# Patient Record
Sex: Female | Born: 1980 | Hispanic: No | Marital: Married | Smoking: Never smoker
Health system: Southern US, Community
[De-identification: ages and names within clinical notes are randomized; demographics above are authoritative.]

---

## 2007-01-06 ENCOUNTER — Emergency Department: Payer: Self-pay | Admitting: Emergency Medicine

## 2007-01-27 ENCOUNTER — Observation Stay: Payer: Self-pay | Admitting: Obstetrics and Gynecology

## 2007-05-04 ENCOUNTER — Observation Stay: Payer: Self-pay | Admitting: Obstetrics and Gynecology

## 2007-05-07 ENCOUNTER — Observation Stay: Payer: Self-pay

## 2007-05-09 ENCOUNTER — Observation Stay: Payer: Self-pay

## 2007-05-12 ENCOUNTER — Observation Stay: Payer: Self-pay

## 2007-05-15 ENCOUNTER — Observation Stay: Payer: Self-pay | Admitting: Obstetrics and Gynecology

## 2007-05-18 ENCOUNTER — Observation Stay: Payer: Self-pay | Admitting: Obstetrics and Gynecology

## 2007-05-20 ENCOUNTER — Observation Stay: Payer: Self-pay

## 2007-05-21 ENCOUNTER — Observation Stay: Payer: Self-pay | Admitting: Obstetrics and Gynecology

## 2007-05-23 ENCOUNTER — Inpatient Hospital Stay: Payer: Self-pay

## 2007-05-27 ENCOUNTER — Emergency Department: Payer: Self-pay | Admitting: Emergency Medicine

## 2007-05-27 ENCOUNTER — Other Ambulatory Visit: Payer: Self-pay

## 2009-06-09 IMAGING — US US OB US >=[ID] SNGL FETUS
1 series · 17 of 28 positions shown · non-contrast
Comparison: none

REASON FOR EXAM: fetal growth
COMMENTS:

[Series 1: us ob us >=(id) sngl fetus · 17 of 40 slices shown]
[im 1/40]
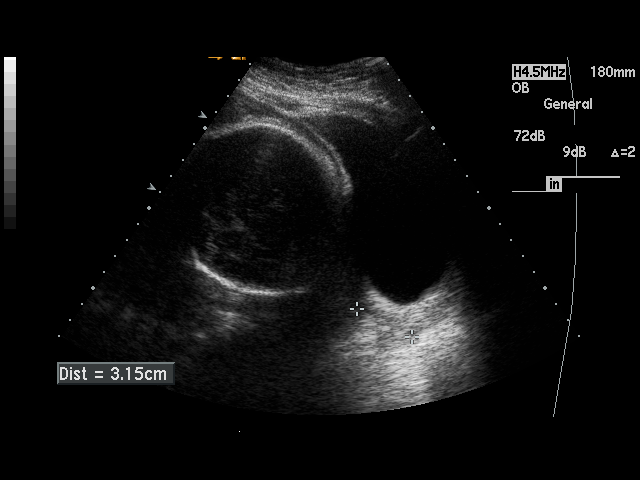
[im 3/40]
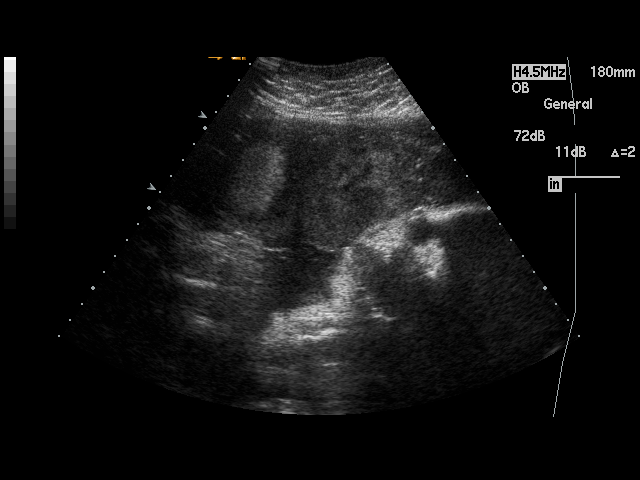
[im 6/40]
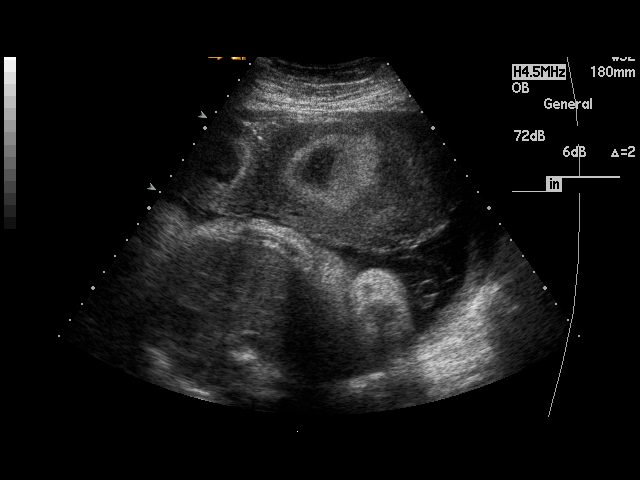
[im 8/40]
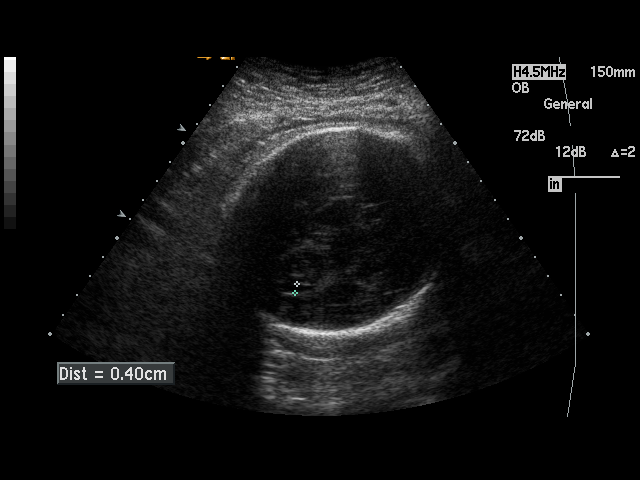
[im 11/40]
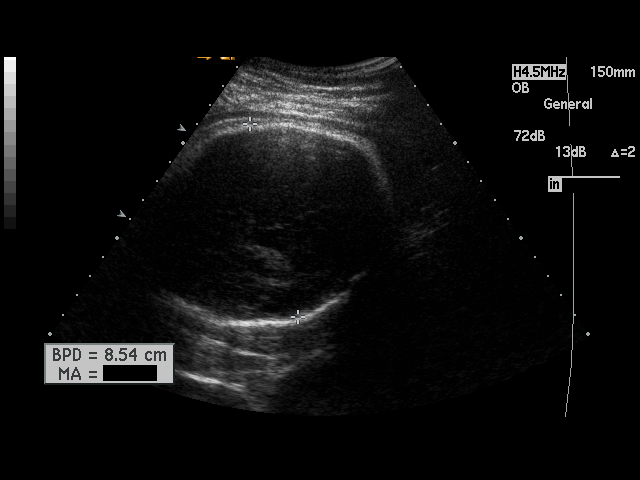
[im 14/40]
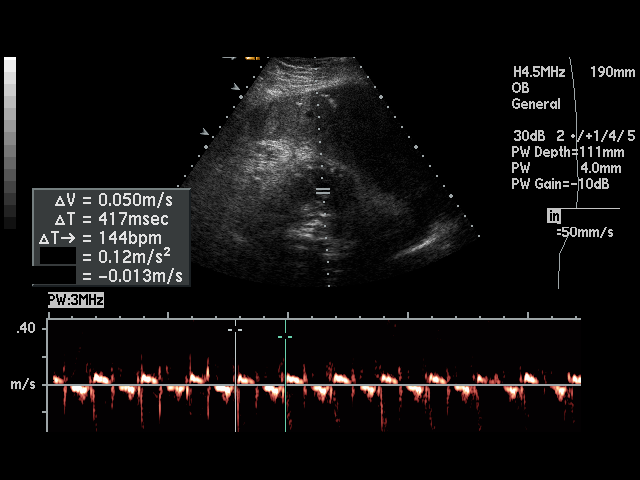
[im 15/40]
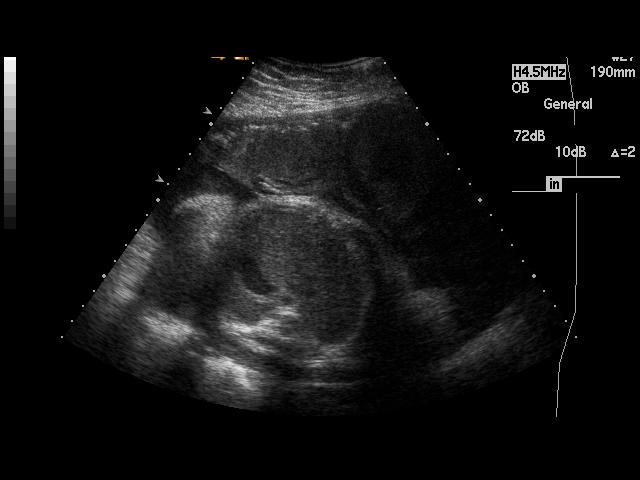
[im 18/40]
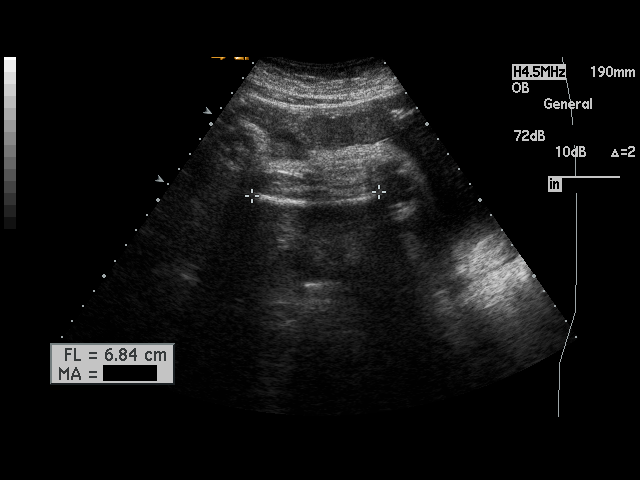
[im 21/40]
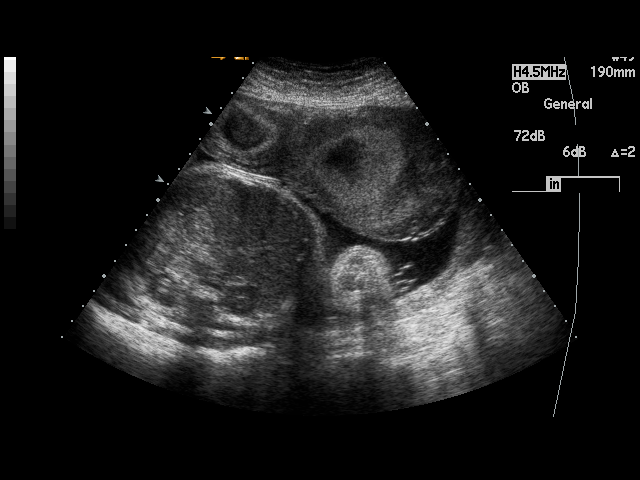
[im 22/40]
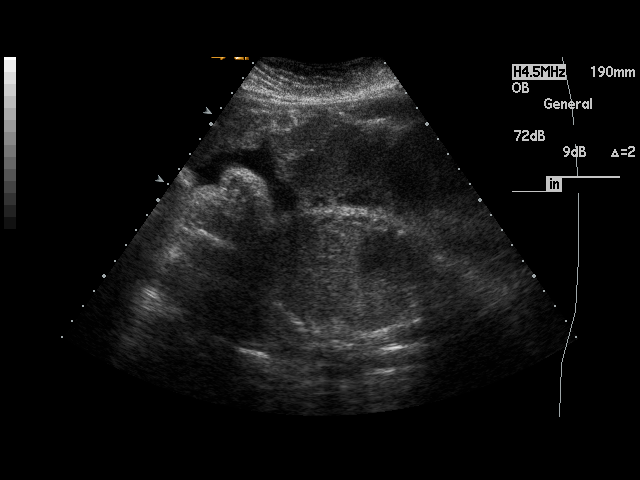
[im 25/40]
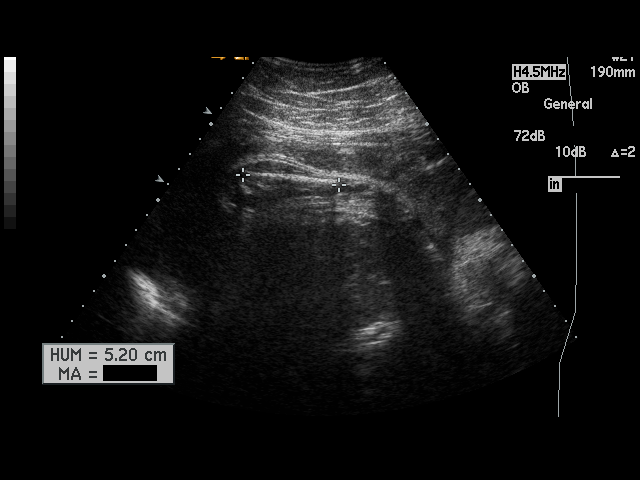
[im 27/40]
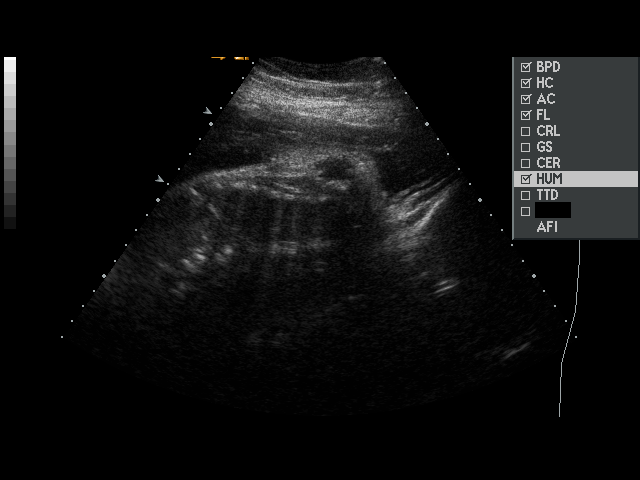
[im 29/40]
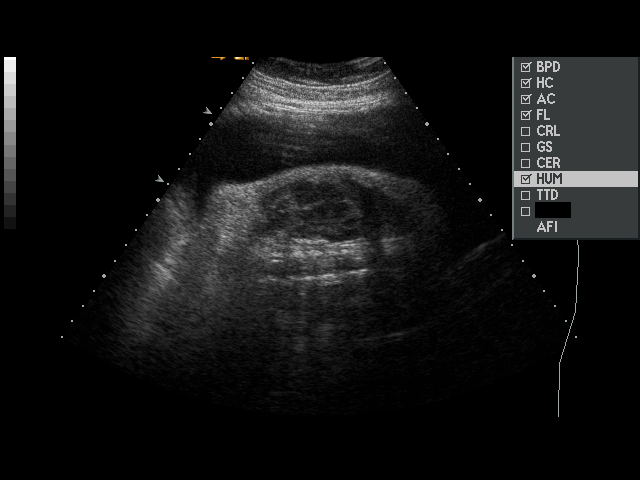
[im 32/40]
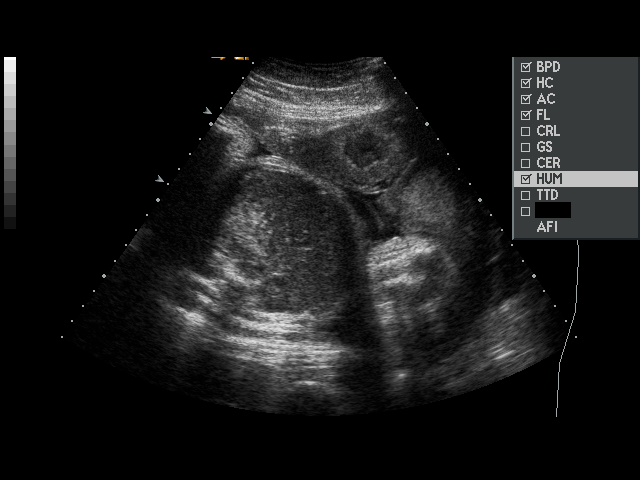
[im 34/40]
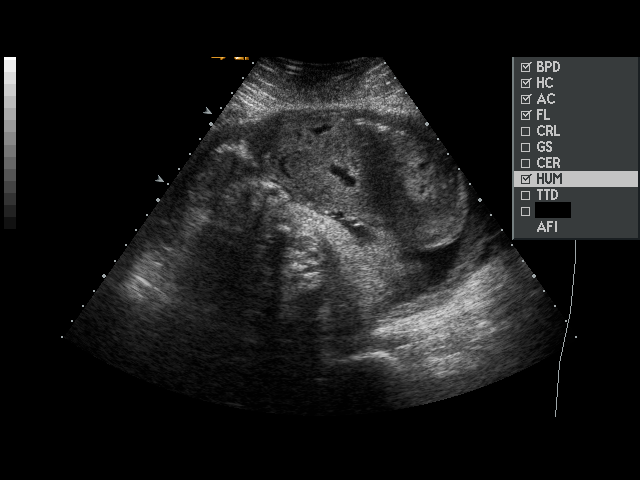
[im 37/40]
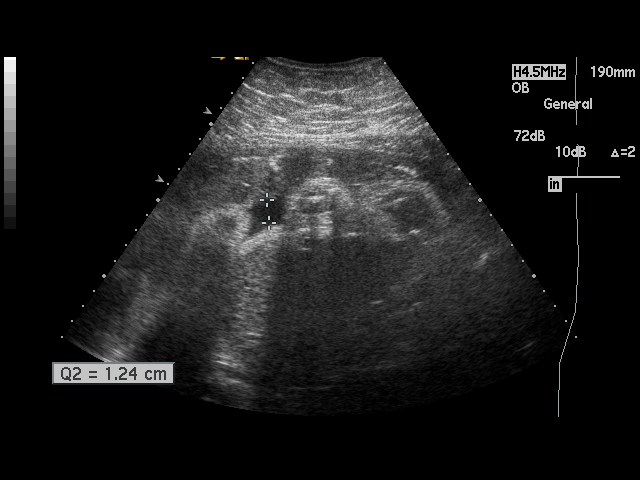
[im 40/40]
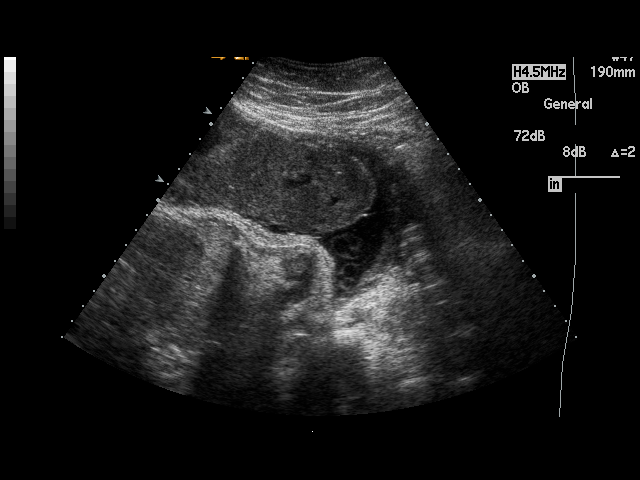

[17 of 28 positions shown; findings below may reference images not displayed]

PROCEDURE:     US  - US OB GREATER/OR EQUAL TO 8CCUS  - May 15, 2007  [DATE]

RESULT:     There is a viable IUP with cephalic presentation. The placenta
is anterior. The amniotic fluid index is 14.5 which is at approximately the
50th percentile. A four-chamber heart with a rate of 144 beats per minute
was seen. The intracranial structures were normal in appearance. Limited
evaluation of the spine revealed no acute abnormality. The fetal stomach,
kidneys, and urinary bladder were demonstrated.

Measured parameters:

BPD       8.54 cm      Corresponding to EGA of 34 weeks 3 days
HC        31.48 cm      Corresponding to EGA of 35 weeks 2 days
AC        31.65 cm      Corresponding to EGA of 35 weeks 4 days
FL          6.88 cm      Corresponding to EGA of 35 weeks 2 days

The estimated fetal weight is 5488 grams + / - 439 grams.
IMPRESSION: There is a viable IUP with cephalic presentation. The estimated gestational
age is 34 weeks 0 days + / - 21 days. The estimated date of confinement is 26 June, 2007. This is approximately 7 days after that date predicted on
clinical grounds. No fetal anomalies were identified.

## 2011-03-07 ENCOUNTER — Encounter: Payer: Self-pay | Admitting: Maternal & Fetal Medicine

## 2011-03-07 LAB — BASIC METABOLIC PANEL
Anion Gap: 10 (ref 7–16)
BUN: 8 mg/dL (ref 7–18)
Chloride: 104 mmol/L (ref 98–107)
Creatinine: 0.37 mg/dL — ABNORMAL LOW (ref 0.60–1.30)
EGFR (African American): >60
EGFR (Non-African Amer.): >60
Osmolality: 277 (ref 275–301)

## 2011-03-07 LAB — HEPATIC FUNCTION PANEL A (ARMC)
Bilirubin, Direct: <0.1 mg/dL (ref 0.00–0.20)
Bilirubin,Total: 0.3 mg/dL (ref 0.2–1.0)
SGPT (ALT): 18 U/L

## 2011-03-07 LAB — PROTEIN / CREATININE RATIO, URINE
Creatinine, Urine: 40.7 mg/dL (ref 30.0–125.0)
Protein, Random Urine: <5 mg/dL — ABNORMAL LOW (ref 0–12)

## 2011-04-07 ENCOUNTER — Encounter: Payer: Self-pay | Admitting: Maternal and Fetal Medicine

## 2011-06-30 ENCOUNTER — Encounter: Payer: Self-pay | Admitting: Obstetrics and Gynecology

## 2011-07-20 ENCOUNTER — Observation Stay: Payer: Self-pay

## 2011-07-20 LAB — URINALYSIS, COMPLETE
Bilirubin,UR: NEGATIVE
Blood: NEGATIVE
Glucose,UR: NEGATIVE mg/dL (ref 0–75)
Ketone: NEGATIVE
Leukocyte Esterase: NEGATIVE
Nitrite: NEGATIVE
Ph: 6 (ref 4.5–8.0)

## 2011-09-09 ENCOUNTER — Observation Stay: Payer: Self-pay

## 2011-09-14 ENCOUNTER — Observation Stay: Payer: Self-pay | Admitting: *Deleted

## 2011-09-14 ENCOUNTER — Inpatient Hospital Stay: Payer: Self-pay | Admitting: Obstetrics and Gynecology

## 2011-09-14 LAB — CBC WITH DIFFERENTIAL/PLATELET
Basophil #: 0 10*3/uL (ref 0.0–0.1)
Eosinophil #: 0 10*3/uL (ref 0.0–0.7)
HCT: 35 % (ref 35.0–47.0)
HGB: 11.4 g/dL — ABNORMAL LOW (ref 12.0–16.0)
Lymphocyte #: 1.1 10*3/uL (ref 1.0–3.6)
Lymphocyte %: 10.5 %
MCH: 28.9 pg (ref 26.0–34.0)
MCHC: 32.6 g/dL (ref 32.0–36.0)
MCV: 89 fL (ref 80–100)
Monocyte #: 0.6 x10 3/mm (ref 0.2–0.9)
Neutrophil #: 8.9 10*3/uL — ABNORMAL HIGH (ref 1.4–6.5)
Neutrophil %: 83.8 %
Platelet: 201 10*3/uL (ref 150–440)
RBC: 3.95 10*6/uL (ref 3.80–5.20)
RDW: 14.8 % — ABNORMAL HIGH (ref 11.5–14.5)
WBC: 10.6 10*3/uL (ref 3.6–11.0)

## 2011-09-16 LAB — HEMOGLOBIN: HGB: 10.1 g/dL — ABNORMAL LOW (ref 12.0–16.0)

## 2014-06-22 NOTE — Consult Note (Signed)
Referral Information:   Reason for Referral 34 yo GG2P0101 with unknown LMP at 13 weeks 1 day by ultrasound today is referred for consultation due to history of preeclampsia in prior pregnancy.  She reports onset of symptoms (edema) and diagnosis at approximately 7 months gestation. She underwent induction of labor (due to worsening blood pressure) and delivered a 5lb 13 oz female infnat at [redacted] weeks gestation.  She is presently taking low dose aspirin.    Referring Physician [facility]    Prenatal Hx as above    Past Obstetrical Hx 04/2007, female, 5lb 13 oz, induction of labor at 36 weeks secondary to severe preeclampsia.   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    Occupation Father retail    Soc Hx married   Misc Urine Chem:  07-Jan-13 09:50    Creatinine, Urine 40.7   Protein, Random Urine < 5  Routine Chem:  07-Jan-13 10:01    Glucose, Serum 88   BUN 8   Creatinine (comp) 0.37   Sodium, Serum 140   Potassium, Serum 3.4   Chloride, Serum 104   CO2, Serum 26   Calcium (Total), Serum 9.0   Anion Gap 10   Osmolality (calc) 277   eGFR (African American) >60   eGFR (Non-African American) >60  Hepatic:  07-Jan-13 10:01    Bilirubin, Total 0.3   Bilirubin, Direct < 0.1   Alkaline Phosphatase 34   SGPT (ALT) 18   SGOT (AST) 16   Total Protein, Serum 7.8   Albumin, Serum 3.3     07-Jan-13 09:42, MFM OB US, Limited Study   Impression/Recommendations:    Impression 34 year old G2P1001 with previous pregnancy complicated by preeclampisa.  We reviewed background risk (5-7%) of preeclampsia and her recurrence risk due to prior history of preterm preeclampsia (I cited an approximately 20% risk for her). We also reviewed the benefits of low dose aspirin for risk reduction in high risk women.  I reviewed recommendations for pregnancy surveillance (below) and addressed her questions.    Recommendations 1. Recommend detailed anatomic survey.  This has been scheduled.  I would also recommend a fetal growth assessment at 30--[redacted] weeks gestation.  If evidence of fetal growth restriction or preeclampsia, surveillance recomemndations will be changed accordingly.  2. I obtained baseline labs today including metabolic panel, liver function testing, and p:c ratio.   Plan:   Genetic Counseling no    Prenatal Diagnosis Options Level II [place]     Total Time Spent with Patient 30 minutes    >50% of visit spent in couseling/coordination of care yes    Office Use Only [address]  Level 2 ( ) NEW office consult exp prob focused   Coding Description: MATERNAL CONDITIONS/HISTORY INDICATION(S).   Obesity - BMI greater than equal to 30.   OTHER: previous pregnancy complicated by preeclampsia.  Electronic Signatures: Manfred Shirts (MD)  (Signed 07-Jan-13 11:41)  Authored: Referral, Home Medications, Allergies, Vital Signs/Notes, Consult, Lab, Radiology, Impression, Plan, Billing, Coding Description   Last Updated: 07-Jan-13 11:41 by Manfred Shirts (MD)

## 2014-07-08 NOTE — H&P (Signed)
L&D Evaluation:  History:   HPI 34 yo G2P0101 with LMP of 09/16/11 & EDD of 09/12/11 [facility] significant for Tdap given at 28 weeks mand 1 PTD aty 36 2/7 weeks with pt taking baby  ASA  q d for previous pre-ecclampsia. Pt c/o " low back pain yest and then today pain moved down to lower abd". Pt is concerned as she delivered early with last pregnancy but, that was due to IOL for pre-ecclampsia.A pos, Antibody neg, RPR NR, RI, GC/CH neg, HebBsAG neg, sickle normal, HIV NR, GC/CH neg, Varivella immune. GBS unknown.    Presents with back pain, contractions    Medications Pre Natal Vitamins    Allergies NKDA    Social History none    Family History Non-Contributory   ROS:   ROS All systems were reviewed.  HEENT, CNS, GI, GU, Respiratory, CV, Renal and Musculoskeletal systems were found to be normal.   Exam:   Vital Signs stable  116/84    Urine Protein 1.008, 3+ bacteria, 6 WBC, 9 epitheal, 1 RBC, neg nitritte, neg protein    General no apparent distress    Mental Status clear    Chest clear    Heart normal sinus rhythm, no murmur/gallop/rubs    Abdomen gravid, non-tender    Estimated Fetal Weight Average for gestational age    Back no CVAT    Edema 1+    Reflexes 1+    Pelvic cervix closed and thick    Mebranes Intact    FHT normal rate with no decels, reactive NST with 2 accels 15 x 15    Ucx irregular    Skin dry    Lymph no lymphadenopathy   Impression:   Impression UTI   Plan:   Plan Antibiotics RX with pt    Comments Limit activity at work to light duty and rest 15 mins every 2 hours   Electronic Signatures: Sharee PimpleJones, Caron W (CNM)  (Signed 22-May-13 16:59)  Authored: L&D Evaluation   Last Updated: 22-May-13 16:59 by Sharee PimpleJones, Caron W (CNM)

## 2014-11-18 ENCOUNTER — Emergency Department
Admission: EM | Admit: 2014-11-18 | Discharge: 2014-11-18 | Disposition: A | Discharge status: Home / Self Care | Payer: 59 | Attending: Emergency Medicine | Admitting: Emergency Medicine

## 2014-11-18 ENCOUNTER — Encounter: Payer: Self-pay | Admitting: Emergency Medicine

## 2014-11-18 DIAGNOSIS — R1030 Lower abdominal pain, unspecified: Secondary | ICD-10-CM | POA: Diagnosis not present

## 2014-11-18 DIAGNOSIS — Z3202 Encounter for pregnancy test, result negative: Secondary | ICD-10-CM | POA: Diagnosis not present

## 2014-11-18 DIAGNOSIS — R300 Dysuria: Secondary | ICD-10-CM | POA: Diagnosis not present

## 2014-11-18 DIAGNOSIS — R350 Frequency of micturition: Secondary | ICD-10-CM | POA: Diagnosis present

## 2014-11-18 LAB — COMPREHENSIVE METABOLIC PANEL
ALBUMIN: 4.1 g/dL (ref 3.5–5.0)
ALT: 16 U/L (ref 14–54)
ANION GAP: 6 (ref 5–15)
AST: 21 U/L (ref 15–41)
Alkaline Phosphatase: 36 U/L — ABNORMAL LOW (ref 38–126)
BILIRUBIN TOTAL: 0.6 mg/dL (ref 0.3–1.2)
BUN: 11 mg/dL (ref 6–20)
CHLORIDE: 103 mmol/L (ref 101–111)
CO2: 29 mmol/L (ref 22–32)
Calcium: 8.9 mg/dL (ref 8.9–10.3)
Creatinine, Ser: 0.6 mg/dL (ref 0.44–1.00)
GFR calc Af Amer: >60 mL/min (ref >60)
GFR calc non Af Amer: >60 mL/min (ref >60)
GLUCOSE: 75 mg/dL (ref 65–99)
POTASSIUM: 3.8 mmol/L (ref 3.5–5.1)
SODIUM: 138 mmol/L (ref 135–145)
TOTAL PROTEIN: 7.5 g/dL (ref 6.5–8.1)

## 2014-11-18 LAB — CBC
HEMATOCRIT: 34.7 % — AB (ref 35.0–47.0)
HEMOGLOBIN: 11.4 g/dL — AB (ref 12.0–16.0)
MCH: 28.3 pg (ref 26.0–34.0)
MCHC: 33 g/dL (ref 32.0–36.0)
MCV: 85.7 fL (ref 80.0–100.0)
Platelets: 250 10*3/uL (ref 150–440)
RBC: 4.04 MIL/uL (ref 3.80–5.20)
RDW: 12.7 % (ref 11.5–14.5)
WBC: 6.9 10*3/uL (ref 3.6–11.0)

## 2014-11-18 LAB — URINALYSIS COMPLETE WITH MICROSCOPIC (ARMC ONLY)
Bilirubin Urine: NEGATIVE
Glucose, UA: NEGATIVE mg/dL
HGB URINE DIPSTICK: NEGATIVE
Ketones, ur: NEGATIVE mg/dL
Leukocytes, UA: NEGATIVE
NITRITE: NEGATIVE
PROTEIN: NEGATIVE mg/dL
SPECIFIC GRAVITY, URINE: 1.012 (ref 1.005–1.030)
WBC UA: NONE SEEN WBC/hpf (ref 0–5)
pH: 6 (ref 5.0–8.0)

## 2014-11-18 LAB — LIPASE, BLOOD: LIPASE: 29 U/L (ref 22–51)

## 2014-11-18 MED ORDER — NITROFURANTOIN MACROCRYSTAL 100 MG PO CAPS: 100.0000 mg | ORAL_CAPSULE | Freq: Two times a day (BID) | ORAL | Status: DC

## 2014-11-18 NOTE — ED Notes (Signed)
preg test was neg. 

## 2014-11-18 NOTE — Discharge Instructions (Signed)

## 2014-11-18 NOTE — ED Notes (Signed)
Pt reports urinary sx since Saturday, reports today lower abdominal pain. Pt reports increased fatigue, hunger.

## 2014-11-18 NOTE — ED Provider Notes (Signed)
[facility] Emergency Department Provider Note  ____________________________________________  Time seen: 3:40 PM  I have reviewed the triage vital signs and the nursing notes.   HISTORY  Chief Complaint Abdominal Pain and Urinary Frequency    HPI Joanne Peterson is a 34 y.o. female who complains of dysuria and frequency for the past 3 days. She also reports feeling tired and having suprapubic pain. No vaginal discharge or bleeding. She is not sexually active anytime recently. No nausea vomiting diarrhea. No fevers chills chest pain or shortness of breath. She's tried cranberry juice without relief. She has a history of urinary tract infections and this feels exactly like it.     History reviewed. No pertinent past medical history.   There are no active problems to display for this patient.    History reviewed. No pertinent past surgical history.   Current Outpatient Rx  Name  Route  Sig  Dispense  Refill  . nitrofurantoin (MACRODANTIN) 100 MG capsule   Oral   Take 1 capsule (100 mg total) by mouth 2 (two) times daily.   6 capsule   0      Allergies Review of patient's allergies indicates no known allergies.   No family history on file.  Social History Social History  Substance Use Topics  . Smoking status: Never Smoker   . Smokeless tobacco: None  . Alcohol Use: No    Review of Systems  Constitutional:   No fever or chills. No weight changes Eyes:   No blurry vision or double vision.  ENT:   No sore throat. Cardiovascular:   No chest pain. Respiratory:   No dyspnea or cough. Gastrointestinal:   Negative for abdominal pain, vomiting and diarrhea.  No BRBPR or melena. Genitourinary:   Positive for dysuria and frequency.  Musculoskeletal:   Negative for back pain. No joint swelling or pain. Skin:   Negative for rash. Neurological:   Negative for headaches, focal weakness or numbness. Psychiatric:  No anxiety or depression.    Endocrine:  No hot/cold intolerance, changes in energy, or sleep difficulty.  10-point ROS otherwise negative.  ____________________________________________   PHYSICAL EXAM:  VITAL SIGNS: ED Triage Vitals  Enc Vitals Group     BP 11/18/14 1351 113/67 mmHg     Pulse Rate 11/18/14 1351 76     Resp 11/18/14 1351 16     Temp 11/18/14 1351 98.4 F (36.9 C)     Temp Source 11/18/14 1351 Oral     SpO2 11/18/14 1351 100 %     Weight 11/18/14 1351 170 lb (77.111 kg)     Height 11/18/14 1351  (1.575 m)     Head Cir --      Peak Flow --      Pain Score 11/18/14 1352 4     Pain Loc --      Pain Edu? --      Excl. in GC? --      Constitutional:   Alert and oriented. Well appearing and in no distress. Eyes:   No scleral icterus. No conjunctival pallor. PERRL. EOMI ENT   Head:   Normocephalic and atraumatic.   Nose:   No congestion/rhinnorhea. No septal hematoma   Mouth/Throat:   MMM, no pharyngeal erythema. No peritonsillar mass. No uvula shift.   Neck:   No stridor. No SubQ emphysema. No meningismus. Hematological/Lymphatic/Immunilogical:   No cervical lymphadenopathy. Cardiovascular:   RRR. Normal and symmetric distal pulses are present in all extremities.  No murmurs, rubs, or gallops. Respiratory:   Normal respiratory effort without tachypnea nor retractions. Breath sounds are clear and equal bilaterally. No wheezes/rales/rhonchi. Gastrointestinal:   Soft with suprapubic tenderness that is mild.. No distention. There is no CVA tenderness.  No rebound, rigidity, or guarding. Genitourinary:   deferred Musculoskeletal:   Nontender with normal range of motion in all extremities. No joint effusions.  No lower extremity tenderness.  No edema. Neurologic:   Normal speech and language.  CN 2-10 normal. Motor grossly intact. No pronator drift.  Normal gait. No gross focal neurologic deficits are appreciated.  Skin:    Skin is warm, dry and intact. No rash noted.  No  petechiae, purpura, or bullae. Psychiatric:   Mood and affect are normal. Speech and behavior are normal. Patient exhibits appropriate insight and judgment.  ____________________________________________    LABS (pertinent positives/negatives) (all labs ordered are listed, but only abnormal results are displayed) Labs Reviewed  COMPREHENSIVE METABOLIC PANEL - Abnormal; Notable for the following:    Alkaline Phosphatase 36 (*)    All other components within normal limits  CBC - Abnormal; Notable for the following:    Hemoglobin 11.4 (*)    HCT 34.7 (*)    All other components within normal limits  URINALYSIS COMPLETEWITH MICROSCOPIC ([facility] ONLY) - Abnormal; Notable for the following:    Color, Urine YELLOW (*)    APPearance CLEAR (*)    Bacteria, UA RARE (*)    Squamous Epithelial / LPF 0-5 (*)    All other components within normal limits  LIPASE, BLOOD  POC URINE PREG, ED   urine pregnancy negative ____________________________________________   EKG    ____________________________________________    RADIOLOGY    ____________________________________________   PROCEDURES   ____________________________________________   INITIAL IMPRESSION / ASSESSMENT AND PLAN / ED COURSE  Pertinent labs & imaging results that were available during my care of the patient were reviewed by me and considered in my medical decision making (see chart for details).  Patient presents with suprapubic pain and dysuria and urinary frequency. Labs and exam are all reassuring. No evidence of urinary tract infection on urinalysis, however the patient's symptoms are entirely consistent with cystitis so we will treat her with a short course of Macrobid. We'll send a urine culture to confirm. Patient counseled to follow up with primary care in a week.     ____________________________________________   FINAL CLINICAL IMPRESSION(S) / ED DIAGNOSES  Final diagnoses:  Dysuria      Sharman Cheek, MD 11/18/14 [phone]

## 2014-11-20 LAB — URINE CULTURE: CULTURE: NO GROWTH

## 2016-04-14 DIAGNOSIS — K5909 Other constipation: Secondary | ICD-10-CM | POA: Insufficient documentation

## 2016-04-22 DIAGNOSIS — E7800 Pure hypercholesterolemia, unspecified: Secondary | ICD-10-CM | POA: Insufficient documentation

## 2016-07-14 DIAGNOSIS — K649 Unspecified hemorrhoids: Secondary | ICD-10-CM | POA: Insufficient documentation

## 2016-07-14 DIAGNOSIS — R140 Abdominal distension (gaseous): Secondary | ICD-10-CM | POA: Insufficient documentation

## 2017-03-30 ENCOUNTER — Ambulatory Visit: Payer: Self-pay | Admitting: Registered Nurse

## 2017-03-30 VITALS — BP 104/79 | HR 68 | Temp 97.1°F

## 2017-03-30 DIAGNOSIS — J019 Acute sinusitis, unspecified: Secondary | ICD-10-CM

## 2017-03-30 DIAGNOSIS — H6593 Unspecified nonsuppurative otitis media, bilateral: Secondary | ICD-10-CM

## 2017-03-30 MED ORDER — SALINE SPRAY 0.65 % NA SOLN: 2.0000 | NASAL | 0 refills | Status: DC

## 2017-03-30 MED ORDER — PHENYLEPHRINE HCL 10 MG PO TABS: 10.0000 mg | ORAL_TABLET | ORAL | 0 refills | Status: AC | PRN

## 2017-03-30 MED ORDER — AMOXICILLIN-POT CLAVULANATE 875-125 MG PO TABS: 1.0000 | ORAL_TABLET | Freq: Two times a day (BID) | ORAL | 0 refills | Status: AC

## 2017-03-30 NOTE — Patient Instructions (Signed)
Otitis Media With Effusion, Pediatric Otitis media with effusion (OME) occurs when there is inflammation of the middle ear and fluid in the middle ear space. There are no signs and symptoms of infection. The middle ear space contains air and the bones for hearing. Air in the middle ear space helps to transmit sound to the brain. OME is a common condition in children, and it often occurs after an ear infection. This condition may be present for several weeks or longer after an ear infection. Most cases of this condition get better on their own. What are the causes? OME is caused by a blockage of the eustachian tube in one or both ears. These tubes drain fluid in the ears to the back of the nose (nasopharynx). If the tissue in the tube swells up (edema), the tube closes. This prevents fluid from draining. Blockage can be caused by:  Ear infections.  Colds and other upper respiratory infections.  Allergies.  Irritants, such as tobacco smoke.  Enlarged adenoids. The adenoids are areas of soft tissue located high in the back of the throat, behind the nose and the roof of the mouth. They are part of the body's natural defense (immune) system.  A mass in the nasopharynx.  Damage to the ear caused by pressure changes (barotrauma).  What increases the risk? Your child is more likely to develop this condition if:  He or she has repeated ear and sinus infections.  He or she has allergies.  He or she is exposed to tobacco smoke.  He or she attends daycare.  He or she is not breastfed.  What are the signs or symptoms? Symptoms of this condition may not be obvious. Sometimes this condition does not have any symptoms, or symptoms may overlap with those of a cold or upper respiratory tract illness. Symptoms of this condition include:  Temporary hearing loss.  A feeling of fullness in the ear without pain.  Irritability or agitation.  Balance (vestibular) problems.  As a result of hearing  loss, your child may:  Listen to the TV at a loud volume.  Not respond to questions.  Ask "What?" often when spoken to.  Mistake or confuse one sound or word for another.  Perform poorly at school.  Have a poor attention span.  Become agitated or irritated easily.  How is this diagnosed? This condition is diagnosed with an ear exam. Your child's health care provider will look inside your child's ear with an instrument (otoscope) to check for redness, swelling, and fluid. Other tests may be done, including:  A test to check the movement of the eardrum (pneumatic otoscopy). This is done by squeezing a small amount of air into the ear.  A test that changes air pressure in the middle ear to check how well the eardrum moves and to see if the eustachian tube is working (tympanogram).  Hearing test (audiogram). This test involves playing tones at different pitches to see if your child can hear each tone.  How is this treated? Treatment for this condition depends on the cause. In many cases, the fluid goes away on its own. In some cases, your child may need a procedure to create a hole in the eardrum to allow fluid to drain (myringotomy) and to insert small drainage tubes (tympanostomy tubes) into the eardrums. These tubes help to drain fluid and prevent infection. This procedure may be recommended if:  OME does not get better over several months.  Your child has many ear   infections within several months.  Your child has noticeable hearing loss.  Your child has problems with speech and language development.  Surgery may also be done to remove the adenoids (adenoidectomy). Follow these instructions at home:  Give over-the-counter and prescription medicines only as told by your child's health care provider.  Keep children away from any tobacco smoke.  Keep all follow-up visits as told by your child's health care provider. This is important. How is this prevented?  Keep your  child's vaccinations up to date. Make sure your child gets all recommended vaccinations, including a pneumonia and flu vaccine.  Encourage hand washing. Your child should wash his or her hands often with soap and water. If there is no soap and water, he or she should use hand sanitizer.  Avoid exposing your child to tobacco smoke.  Breastfeed your baby, if possible. Babies who are breastfed as long as possible are less likely to develop this condition. Contact a health care provider if:  Your child's hearing does not get better after 3 months.  Your child's hearing is worse.  Your child has ear pain.  Your child has a fever.  Your child has drainage from the ear.  Your child is dizzy.  Your child has a lump on his or her neck. Get help right away if:  Your child has bleeding from the nose.  Your child cannot move part of her or his face.  Your child has trouble breathing.  Your child cannot smell.  Your child develops severe congestion.  Your child develops weakness.  Your child who is younger than 3 months has a temperature of 100F (38C) or higher. Summary  Otitis media with effusion (OME) occurs when there is inflammation of the middle ear and fluid in the middle ear space.  This condition is caused by blockage of one or both eustachian tubes, which drain fluid in the ears to the back of the nose.  Symptoms of this condition can include temporary hearing loss, a feeling of fullness in the ear, irritability or agitation, and balance (vestibular) problems. Sometimes, there are no symptoms.  This condition is diagnosed with an ear exam and tests, such as pneumatic otoscopy, tympanogram, and audiogram.  Treatment for this condition depends on the cause. In many cases, the fluid goes away on its own. This information is not intended to replace advice given to you by your health care provider. Make sure you discuss any questions you have with your health care  provider. Document Released: 05/07/2003 Document Revised: 01/07/2016 Document Reviewed: 01/07/2016 Elsevier Interactive Patient Education  2017 [employer] Sinusitis, Adult Sinusitis is soreness and inflammation of your sinuses. Sinuses are hollow spaces in the bones around your face. Your sinuses are located:  Around your eyes.  In the middle of your forehead.  Behind your nose.  In your cheekbones.  Your sinuses and nasal passages are lined with a stringy fluid (mucus). Mucus normally drains out of your sinuses. When your nasal tissues become inflamed or swollen, the mucus can become trapped or blocked so air cannot flow through your sinuses. This allows bacteria, viruses, and funguses to grow, which leads to infection. Sinusitis can develop quickly and last for 7?10 days (acute) or for more than 12 weeks (chronic). Sinusitis often develops after a cold. What are the causes? This condition is caused by anything that creates swelling in the sinuses or stops mucus from draining, including:  Allergies.  Asthma.  Bacterial or viral infection.  Abnormally shaped  bones between the nasal passages.  Nasal growths that contain mucus (nasal polyps).  Narrow sinus openings.  Pollutants, such as chemicals or irritants in the air.  A foreign object stuck in the nose.  A fungal infection. This is rare.  What increases the risk? The following factors may make you more likely to develop this condition:  Having allergies or asthma.  Having had a recent cold or respiratory tract infection.  Having structural deformities or blockages in your nose or sinuses.  Having a weak immune system.  Doing a lot of swimming or diving.  Overusing nasal sprays.  Smoking.  What are the signs or symptoms? The main symptoms of this condition are pain and a feeling of pressure around the affected sinuses. Other symptoms include:  Upper toothache.  Earache.  Headache.  Bad  breath.  Decreased sense of smell and taste.  A cough that may get worse at night.  Fatigue.  Fever.  Thick drainage from your nose. The drainage is often green and it may contain pus (purulent).  Stuffy nose or congestion.  Postnasal drip. This is when extra mucus collects in the throat or back of the nose.  Swelling and warmth over the affected sinuses.  Sore throat.  Sensitivity to light.  How is this diagnosed? This condition is diagnosed based on symptoms, a medical history, and a physical exam. To find out if your condition is acute or chronic, your health care provider may:  Look in your nose for signs of nasal polyps.  Tap over the affected sinus to check for signs of infection.  View the inside of your sinuses using an imaging device that has a light attached (endoscope).  If your health care provider suspects that you have chronic sinusitis, you may also:  Be tested for allergies.  Have a sample of mucus taken from your nose (nasal culture) and checked for bacteria.  Have a mucus sample examined to see if your sinusitis is related to an allergy.  If your sinusitis does not respond to treatment and it lasts longer than 8 weeks, you may have an MRI or CT scan to check your sinuses. These scans also help to determine how severe your infection is. In rare cases, a bone biopsy may be done to rule out more serious types of fungal sinus disease. How is this treated? Treatment for sinusitis depends on the cause and whether your condition is chronic or acute. If a virus is causing your sinusitis, your symptoms will go away on their own within 10 days. You may be given medicines to relieve your symptoms, including:  Topical nasal decongestants. They shrink swollen nasal passages and let mucus drain from your sinuses.  Antihistamines. These drugs block inflammation that is triggered by allergies. This can help to ease swelling in your nose and sinuses.  Topical nasal  corticosteroids. These are nasal sprays that ease inflammation and swelling in your nose and sinuses.  Nasal saline washes. These rinses can help to get rid of thick mucus in your nose.  If your condition is caused by bacteria, you will be given an antibiotic medicine. If your condition is caused by a fungus, you will be given an antifungal medicine. Surgery may be needed to correct underlying conditions, such as narrow nasal passages. Surgery may also be needed to remove polyps. Follow these instructions at home: Medicines  Take, use, or apply over-the-counter and prescription medicines only as told by your health care provider. These may include nasal sprays.  If you were prescribed an antibiotic medicine, take it as told by your health care provider. Do not stop taking the antibiotic even if you start to feel better. Hydrate and Humidify  Drink enough water to keep your urine clear or pale yellow. Staying hydrated will help to thin your mucus.  Use a cool mist humidifier to keep the humidity level in your home above 50%.  Inhale steam for 10-15 minutes, 3-4 times a day or as told by your health care provider. You can do this in the bathroom while a hot shower is running.  Limit your exposure to cool or dry air. Rest  Rest as much as possible.  Sleep with your head raised (elevated).  Make sure to get enough sleep each night. General instructions  Apply a warm, moist washcloth to your face 3-4 times a day or as told by your health care provider. This will help with discomfort.  Wash your hands often with soap and water to reduce your exposure to viruses and other germs. If soap and water are not available, use hand sanitizer.  Do not smoke. Avoid being around people who are smoking (secondhand smoke).  Keep all follow-up visits as told by your health care provider. This is important. Contact a health care provider if:  You have a fever.  Your symptoms get worse.  Your  symptoms do not improve within 10 days. Get help right away if:  You have a severe headache.  You have persistent vomiting.  You have pain or swelling around your face or eyes.  You have vision problems.  You develop confusion.  Your neck is stiff.  You have trouble breathing. This information is not intended to replace advice given to you by your health care provider. Make sure you discuss any questions you have with your health care provider. Document Released: 02/14/2005 Document Revised: 10/11/2015 Document Reviewed: 12/10/2014 Elsevier Interactive Patient Education  2018 [employer] Sinus Rinse What is a sinus rinse? A sinus rinse is a simple home treatment that is used to rinse your sinuses with a sterile mixture of salt and water (saline solution). Sinuses are air-filled spaces in your skull behind the bones of your face and forehead that open into your nasal cavity. You will use the following:  Saline solution.  Neti pot or spray bottle. This releases the saline solution into your nose and through your sinuses. Neti pots and spray bottles can be purchased at [facility] local [facility], a health food store, or online.  When would I do a sinus rinse? A sinus rinse can help to clear mucus, dirt, dust, or pollen from the nasal cavity. You may do a sinus rinse when you have a cold, a virus, nasal allergy symptoms, a sinus infection, or stuffiness in the nose or sinuses. If you are considering a sinus rinse:  Ask your child's health care provider before performing a sinus rinse on your child.  Do not do a sinus rinse if you have had ear or nasal surgery, ear infection, or blocked ears.  How do I do a sinus rinse?  Wash your hands.  Disinfect your device according to the directions provided and then dry it.  Use the solution that comes with your device or one that is sold separately in stores. Follow the mixing directions on the package.  Fill your device with the amount of  saline solution as directed by the device instructions.  Stand over a sink and tilt your head sideways over the  sink.  Place the spout of the device in your upper nostril (the one closer to the ceiling).  Gently pour or squeeze the saline solution into the nasal cavity. The liquid should drain to the lower nostril if you are not overly congested.  Gently blow your nose. Blowing too hard may cause ear pain.  Repeat in the other nostril.  Clean and rinse your device with clean water and then air-dry it. Are there risks of a sinus rinse? Sinus rinse is generally very safe and effective. However, there are a few risks, which include:  A burning sensation in the sinuses. This may happen if you do not make the saline solution as directed. Make sure to follow all directions when making the saline solution.  Infection from contaminated water. This is rare, but possible.  Nasal irritation.  This information is not intended to replace advice given to you by your health care provider. Make sure you discuss any questions you have with your health care provider. Document Released: 09/11/2013 Document Revised: 01/12/2016 Document Reviewed: 07/02/2013 Elsevier Interactive Patient Education  2017 [employer].

## 2017-03-30 NOTE — Progress Notes (Signed)
Subjective:    Patient ID: Joanne Peterson, female    DOB: Sep 10, 1980, 37 y.o.   MRN: [id number]  36y/o new female patient Pt c/o runny nose, nasal congestion, L ear pressure x2 days. No OTCs at home.   Has been using her nose sprays without any relief and left ear bothering her itching with finger prn denies using cotton swabs in ears.  + sick contacts at work.      Review of Systems  Constitutional: Negative for activity change, appetite change, chills, diaphoresis, fatigue, fever and unexpected weight change.  HENT: Positive for congestion, ear pain, postnasal drip, rhinorrhea, sinus pressure and sinus pain. Negative for dental problem, drooling, ear discharge, facial swelling, hearing loss, mouth sores, nosebleeds, sneezing, sore throat, tinnitus, trouble swallowing and voice change.   Eyes: Negative for photophobia, pain, discharge, redness, itching and visual disturbance.  Respiratory: Negative for cough, choking, chest tightness, shortness of breath, wheezing and stridor.   Cardiovascular: Negative for chest pain, palpitations and leg swelling.  Gastrointestinal: Negative for abdominal distention, abdominal pain, blood in stool, diarrhea, nausea and vomiting.  Endocrine: Negative for cold intolerance and heat intolerance.  Genitourinary: Negative for difficulty urinating, dysuria and hematuria.  Musculoskeletal: Negative for arthralgias, back pain, gait problem, joint swelling, myalgias, neck pain and neck stiffness.  Skin: Negative for color change, pallor, rash and wound.  Allergic/Immunologic: Positive for environmental allergies. Negative for food allergies.  Neurological: Positive for headaches. Negative for dizziness, tremors, seizures, syncope, facial asymmetry, speech difficulty, weakness, light-headedness and numbness.  Hematological: Negative for adenopathy. Does not bruise/bleed easily.  Psychiatric/Behavioral: Negative for agitation, confusion and sleep disturbance.        Objective:   Physical Exam  Constitutional: She is oriented to person, place, and time. Vital signs are normal. She appears well-developed and well-nourished. She is active and cooperative.  Non-toxic appearance. She does not have a sickly appearance. She appears ill. No distress.  HENT:  Head: Normocephalic and atraumatic.  Right Ear: Hearing, external ear and ear canal normal. A middle ear effusion is present.  Left Ear: Hearing, external ear and ear canal normal. A middle ear effusion is present.  Nose: Mucosal edema and rhinorrhea present. No nose lacerations, sinus tenderness, nasal deformity, septal deviation or nasal septal hematoma. No epistaxis.  No foreign bodies. Right sinus exhibits no maxillary sinus tenderness and no frontal sinus tenderness. Left sinus exhibits no maxillary sinus tenderness and no frontal sinus tenderness.  Mouth/Throat: Uvula is midline and mucous membranes are normal. Mucous membranes are not pale, not dry and not cyanotic. She does not have dentures. No oral lesions. No trismus in the jaw. Normal dentition. No dental abscesses, uvula swelling, lacerations or dental caries. Posterior oropharyngeal edema and posterior oropharyngeal erythema present. No oropharyngeal exudate or tonsillar abscesses.  Cobblestoning posterior pharynx; bilateral TMs air fluid level clear; left distal auditory canal with scaling  Skin dry flaky no erythema or pus noted; bilateral allergic shiners; bilateral TMs air fluid level clear  Eyes: Conjunctivae, EOM and lids are normal. Pupils are equal, round, and reactive to light. Right eye exhibits no chemosis, no discharge, no exudate and no hordeolum. No foreign body present in the right eye. Left eye exhibits no chemosis, no discharge, no exudate and no hordeolum. No foreign body present in the left eye. Right conjunctiva is not injected. Right conjunctiva has no hemorrhage. Left conjunctiva is not injected. Left conjunctiva has no hemorrhage. No  scleral icterus. Right eye exhibits normal extraocular motion and  no nystagmus. Left eye exhibits normal extraocular motion and no nystagmus. Right pupil is round and reactive. Left pupil is round and reactive. Pupils are equal.  Neck: Trachea normal, normal range of motion and phonation normal. Neck supple. No tracheal tenderness and no muscular tenderness present. No neck rigidity. No tracheal deviation, no edema, no erythema and normal range of motion present. No thyroid mass and no thyromegaly present.  Cardiovascular: Normal rate, regular rhythm, S1 normal, S2 normal, normal heart sounds and intact distal pulses. PMI is not displaced. Exam reveals no gallop, no distant heart sounds and no friction rub.  No murmur heard. Pulmonary/Chest: Effort normal and breath sounds normal. No accessory muscle usage or stridor. No respiratory distress. She has no decreased breath sounds. She has no wheezes. She has no rhonchi. She has no rales. She exhibits no tenderness.  No cough observed in exam room; spoke full sentences without difficulty  Abdominal: Soft. Normal appearance. She exhibits no distension, no fluid wave and no ascites. There is no rigidity and no guarding.  Musculoskeletal: Normal range of motion. She exhibits no edema or tenderness.       Right shoulder: Normal.       Left shoulder: Normal.       Right elbow: Normal.      Left elbow: Normal.       Right hip: Normal.       Left hip: Normal.       Right knee: Normal.       Left knee: Normal.       Cervical back: Normal.       Thoracic back: Normal.       Lumbar back: Normal.       Right hand: Normal.       Left hand: Normal.  Lymphadenopathy:       Head (right side): No submental, no submandibular, no tonsillar, no preauricular, no posterior auricular and no occipital adenopathy present.       Head (left side): No submental, no submandibular, no tonsillar, no preauricular, no posterior auricular and no occipital adenopathy present.     She has no cervical adenopathy.       Right cervical: No superficial cervical, no deep cervical and no posterior cervical adenopathy present.      Left cervical: No superficial cervical, no deep cervical and no posterior cervical adenopathy present.  Neurological: She is alert and oriented to person, place, and time. She has normal strength. She is not disoriented. She displays no atrophy and no tremor. No cranial nerve deficit or sensory deficit. She exhibits normal muscle tone. She displays no seizure activity. Coordination and gait normal. GCS eye subscore is 4. GCS verbal subscore is 5. GCS motor subscore is 6.  On/off exam table; in/out of chair without difficulty; gait sure and steady in hallway  Skin: Skin is warm, dry and intact. No abrasion, no bruising, no burn, no ecchymosis, no laceration, no lesion, no petechiae and no rash noted. She is not diaphoretic. No cyanosis or erythema. No pallor. Nails show no clubbing.  Psychiatric: She has a normal mood and affect. Her speech is normal and behavior is normal. Judgment and thought content normal. Cognition and memory are normal.  Nursing note and vitals reviewed.         Assessment & Plan:  A-acute rhinosinusitis, bilateral otitis media effusion  P-Patient may use normal saline nasal spray 2 sprays each nostril q2h wa as needed. flonase 1 spray each nostril BID #1  RF0.  If worsening sinus pain and pressure despite plan of care start augmentin 875mg  po BID X 10 days #20 RF0 dispensed from [facility] to patient.  Tylenol 1000mg  po QID prn pain 4 UD given to patient from clinic stock.  Phenylephrine 5mg  po q4-6h prn rhinitis/congestion 4 UD given to patient from clinic stock.  Discussed can sometimes cause drowsiness.  Patient denied personal or family history of ENT cancer.  OTC antihistamine of choice claritin/zyrtec 10mg  po daily.  Avoid triggers if possible.  Shower BID Call or return to clinic as needed if these symptoms worsen or fail to  improve as anticipated.   Exitcare handout on nonallergic rhinitis, sinusitis and sinus rinse given to patient.  Patient verbalized understanding of instructions, agreed with plan of care and had no further questions at this time.  P2:  Avoidance and hand washing.  Put 5 drops mineral oil or olive oil in left ear canal daily.  Avoid scratching as this can cause abrasions/skin infection.  May use ice prn also.  Consider zyrtec 10mg  po daily prn itching.  Supportive treatment.   No evidence of invasive bacterial infection, non toxic and well hydrated.  This is most likely self limiting viral infection.  I do not see where any further testing or imaging is necessary at this time.   I will suggest supportive care, rest, good hygiene and encourage the patient to take adequate fluids.  The patient is to return to clinic or EMERGENCY ROOM if symptoms worsen or change significantly e.g. ear pain, fever, purulent discharge from ears or bleeding.  Exitcare handout on otitis media with effusion given to patient.  Patient verbalized agreement and understanding of treatment plan.

## 2017-05-16 ENCOUNTER — Encounter: Payer: Self-pay | Admitting: *Deleted

## 2017-05-16 ENCOUNTER — Telehealth: Payer: Self-pay | Admitting: *Deleted

## 2017-05-16 MED ORDER — BENZONATATE 200 MG PO CAPS: 200.0000 mg | ORAL_CAPSULE | Freq: Three times a day (TID) | ORAL | 0 refills | Status: DC | PRN

## 2017-05-16 NOTE — Telephone Encounter (Signed)
Pt sts she was seen at an Urgent Care on 04/10/17 and given Tessalon pearles for non productive cough. She brings the Rx bottle with her. Sts she was feeling better but cough has returned and would like a refill of the Tessalon to help her stop coughing to sleep at night. Verbal order from NP [facility] received for refill. Also advised to start daily Claritin/Zyrtec during spring/summer, use nasal saline spray. Also gave pt phenylephrine tabs for maxillary sinus pressure with instructions to f/u if sx worsen. Pharmacy confirmed [facility] [address] [place].

## 2018-09-12 ENCOUNTER — Encounter: Payer: Self-pay | Admitting: Emergency Medicine

## 2018-09-12 ENCOUNTER — Other Ambulatory Visit: Payer: Self-pay

## 2018-09-12 ENCOUNTER — Emergency Department
Admission: EM | Admit: 2018-09-12 | Discharge: 2018-09-12 | Disposition: A | Discharge status: Home / Self Care | Payer: BLUE CROSS/BLUE SHIELD | Attending: Emergency Medicine | Admitting: Emergency Medicine

## 2018-09-12 DIAGNOSIS — Z79899 Other long term (current) drug therapy: Secondary | ICD-10-CM | POA: Diagnosis not present

## 2018-09-12 DIAGNOSIS — R1111 Vomiting without nausea: Secondary | ICD-10-CM

## 2018-09-12 DIAGNOSIS — R110 Nausea: Secondary | ICD-10-CM | POA: Insufficient documentation

## 2018-09-12 LAB — CBC
HCT: 36.6 % (ref 36.0–46.0)
Hemoglobin: 11.9 g/dL — ABNORMAL LOW (ref 12.0–15.0)
MCH: 28.3 pg (ref 26.0–34.0)
MCHC: 32.5 g/dL (ref 30.0–36.0)
MCV: 87.1 fL (ref 80.0–100.0)
Platelets: 281 10*3/uL (ref 150–400)
RBC: 4.2 MIL/uL (ref 3.87–5.11)
RDW: 11.9 % (ref 11.5–15.5)
WBC: 6.2 10*3/uL (ref 4.0–10.5)
nRBC: 0 % (ref 0.0–0.2)

## 2018-09-12 LAB — COMPREHENSIVE METABOLIC PANEL
ALT: 15 U/L (ref 0–44)
AST: 22 U/L (ref 15–41)
Albumin: 4.3 g/dL (ref 3.5–5.0)
Alkaline Phosphatase: 37 U/L — ABNORMAL LOW (ref 38–126)
Anion gap: 8 (ref 5–15)
BUN: 17 mg/dL (ref 6–20)
CO2: 26 mmol/L (ref 22–32)
Calcium: 9.3 mg/dL (ref 8.9–10.3)
Chloride: 105 mmol/L (ref 98–111)
Creatinine, Ser: 0.59 mg/dL (ref 0.44–1.00)
GFR calc Af Amer: >60 mL/min (ref >60)
GFR calc non Af Amer: >60 mL/min (ref >60)
Glucose, Bld: 114 mg/dL — ABNORMAL HIGH (ref 70–99)
Potassium: 3.9 mmol/L (ref 3.5–5.1)
Sodium: 139 mmol/L (ref 135–145)
Total Bilirubin: 0.7 mg/dL (ref 0.3–1.2)
Total Protein: 8.1 g/dL (ref 6.5–8.1)

## 2018-09-12 LAB — LIPASE, BLOOD: Lipase: 43 U/L (ref 11–51)

## 2018-09-12 MED ORDER — FAMOTIDINE 20 MG PO TABS: 20.0000 mg | ORAL_TABLET | Freq: Two times a day (BID) | ORAL | 0 refills | Status: DC

## 2018-09-12 MED ORDER — ALUM & MAG HYDROXIDE-SIMETH 200-200-20 MG/5ML PO SUSP
30.0000 mL | Freq: Once | ORAL | Status: AC
  Administered 2018-09-12: 30 mL via ORAL
  Filled 2018-09-12: qty 30

## 2018-09-12 MED ORDER — ALUMINUM-MAGNESIUM-SIMETHICONE 200-200-20 MG/5ML PO SUSP: 30.0000 mL | Freq: Three times a day (TID) | ORAL | 0 refills | Status: DC

## 2018-09-12 MED ORDER — FAMOTIDINE 20 MG PO TABS
40.0000 mg | ORAL_TABLET | Freq: Once | ORAL | Status: AC
  Administered 2018-09-12: 40 mg via ORAL
  Filled 2018-09-12: qty 2

## 2018-09-12 NOTE — ED Notes (Signed)
Patient denies nausea at this time, says it was "just that once"

## 2018-09-12 NOTE — ED Triage Notes (Signed)
Pt with bloody emesis x 2 approx 1 hr ago after eating. Prior to that pt felt normal. Pt c/o throat soreness after that. NAD at this time.

## 2018-09-12 NOTE — ED Notes (Signed)
Patient able to tolerate crackers and water.

## 2018-09-12 NOTE — ED Notes (Signed)
Patient provided with water and graham crackers for PO challenge

## 2018-09-12 NOTE — ED Provider Notes (Signed)
[facility] Emergency Department Provider Note  ____________________________________________  Time seen: Approximately 8:41 PM  I have reviewed the triage vital signs and the nursing notes.   HISTORY  Chief Complaint Emesis    HPI Joanne Peterson is a 38 y.o. female with a past medical history of hemorrhoids and chronic constipation who comes to the ED complaining of  vomiting at 3:00 PM today.  Patient was in her usual state of health, had just sat down to eat lunch with her kids, and after eating a few bites threw up 2 times.  She denies any abdominal pain.  No nausea.  Feels back to normal now.  No recent sick contacts fevers chills.  No diarrhea.  She does have chronic constipation which she manages with episodic MiraLAX.  Last bowel movement was about 4 days ago.     History reviewed. No pertinent past medical history.   Patient Active Problem List   Diagnosis Date Noted  . Abdominal bloating 07/14/2016  . Hemorrhoids without complication 07/14/2016  . Pure hypercholesterolemia 04/22/2016  . Chronic constipation 04/14/2016     History reviewed. No pertinent surgical history.   Prior to Admission medications   Medication Sig Start Date End Date Taking? Authorizing Provider  acetaminophen (TYLENOL) 325 MG tablet Take 2 tablets by mouth daily as needed.    [provider]  aluminum-magnesium hydroxide-simethicone (MAALOX) 200-200-20 MG/5ML SUSP Take 30 mLs by mouth 4 (four) times daily -  before meals and at bedtime. 09/12/18   Sharman CheekStafford, Aman Batley, MD  azelastine (ASTELIN) 0.1 % nasal spray Place 1 spray into the nose daily as needed. 11/14/16   [provider]  benzonatate (TESSALON) 200 MG capsule Take 1 capsule (200 mg total) by mouth 3 (three) times daily as needed for cough. 05/16/17   Betancourt, Jarold Songina A, NP  famotidine (PEPCID) 20 MG tablet Take 1 tablet (20 mg total) by mouth 2 (two) times daily. 09/12/18   Sharman CheekStafford, Keiera Strathman, MD   fluticasone [facility](FLONASE) 50 MCG/ACT nasal spray Place 2 sprays into the nose daily as needed. 06/27/14   [provider]  metaxalone (SKELAXIN) 800 MG tablet Take 800 mg by mouth 3 (three) times daily as needed for muscle spasms. 04/10/17   [provider]  sodium chloride (OCEAN) 0.65 % SOLN nasal spray Place 2 sprays into both nostrils every 2 (two) hours while awake. 03/30/17 04/29/17  Betancourt, Jarold Songina A, NP     Allergies Patient has no known allergies.   History reviewed. No pertinent family history.  Social History Social History   Tobacco Use  . Smoking status: Never Smoker  Substance Use Topics  . Alcohol use: No  . Drug use: Not on file    Review of Systems  Constitutional:   No fever or chills.  ENT:   No sore throat. No rhinorrhea. Cardiovascular:   No chest pain or syncope. Respiratory:   No dyspnea or cough. Gastrointestinal:   Negative for abdominal pain, positive vomiting as above.  Chronic constipation.  No black or bloody stool Musculoskeletal:   Negative for focal pain or swelling All other systems reviewed and are negative except as documented above in ROS and HPI.  ____________________________________________   PHYSICAL EXAM:  VITAL SIGNS: ED Triage Vitals  Enc Vitals Group     BP 09/12/18 1621 124/82     Pulse Rate 09/12/18 1621 71     Resp --      Temp 09/12/18 1621 98.4 F (36.9 C)  Temp Source 09/12/18 1621 Oral     SpO2 09/12/18 1621 99 %     Weight 09/12/18 1622 178 lb (80.7 kg)     Height 09/12/18 1622 5\' 2"  (1.575 m)     Head Circumference --      Peak Flow --      Pain Score 09/12/18 1622 0     Pain Loc --      Pain Edu? --      Excl. in [place]? --     Vital signs reviewed, nursing assessments reviewed.   Constitutional:   Alert and oriented. Non-toxic appearance. Eyes:   Conjunctivae are normal. EOMI. PERRL. ENT      Head:   Normocephalic and atraumatic.      Nose:   No congestion/rhinnorhea.       Mouth/Throat:    MMM, mild pharyngeal erythema. No peritonsillar mass.       Neck:   No meningismus. Full ROM. Hematological/Lymphatic/Immunilogical:   No cervical lymphadenopathy. Cardiovascular:   RRR. Symmetric bilateral radial and DP pulses.  No murmurs. Cap refill less than 2 seconds. Respiratory:   Normal respiratory effort without tachypnea/retractions. Breath sounds are clear and equal bilaterally. No wheezes/rales/rhonchi. Gastrointestinal:   Soft and nontender. Non distended. There is no CVA tenderness.  No rebound, rigidity, or guarding.  Musculoskeletal:   Normal range of motion in all extremities. No joint effusions.  No lower extremity tenderness.  No edema. Neurologic:   Normal speech and language.  Motor grossly intact. No acute focal neurologic deficits are appreciated.  Skin:    Skin is warm, dry and intact. No rash noted.  No petechiae, purpura, or bullae.  ____________________________________________    LABS (pertinent positives/negatives) (all labs ordered are listed, but only abnormal results are displayed) Labs Reviewed  COMPREHENSIVE METABOLIC PANEL - Abnormal; Notable for the following components:      Result Value   Glucose, Bld 114 (*)    Alkaline Phosphatase 37 (*)    All other components within normal limits  CBC - Abnormal; Notable for the following components:   Hemoglobin 11.9 (*)    All other components within normal limits  LIPASE, BLOOD   ____________________________________________   EKG    ____________________________________________    RADIOLOGY  No results found.  ____________________________________________   PROCEDURES Procedures  ____________________________________________    CLINICAL IMPRESSION / ASSESSMENT AND PLAN / ED COURSE  Medications ordered in the ED: Medications  alum & mag hydroxide-simeth (MAALOX/MYLANTA) 200-200-20 MG/5ML suspension 30 mL (30 mLs Oral Given 09/12/18 2003)  famotidine (PEPCID) tablet 40 mg (40 mg Oral  Given 09/12/18 2003)    Pertinent labs & imaging results that were available during my care of the patient were reviewed by me and considered in my medical decision making (see chart for details).  MARYAH MARINARO was evaluated in Emergency Department on 09/12/2018 for the symptoms described in the history of present illness. She was evaluated in the context of the global COVID-19 pandemic, which necessitated consideration that the patient might be at risk for infection with the SARS-CoV-2 virus that causes COVID-19. Institutional protocols and algorithms that pertain to the evaluation of patients at risk for COVID-19 are in a state of rapid change based on information released by regulatory bodies including the CDC and federal and state organizations. These policies and algorithms were followed during the patient's care in the ED.   Patient presents with an isolated episode of vomiting.  Vital signs exam and labs are all unremarkable  now and reassuring.  She is tolerating oral intake.  I will have her take Pepcid and Maalox and continue to laxatives as needed and follow-up with primary care.Considering the patient's symptoms, medical history, and physical examination today, I have low suspicion for cholecystitis or biliary pathology, pancreatitis, perforation or bowel obstruction, hernia, intra-abdominal abscess, AAA or dissection, volvulus or intussusception, mesenteric ischemia, or appendicitis.  Doubt ACS PE dissection or sepsis.        ____________________________________________   FINAL CLINICAL IMPRESSION(S) / ED DIAGNOSES    Final diagnoses:  Non-intractable vomiting without nausea, unspecified vomiting type     ED Discharge Orders         Ordered    famotidine (PEPCID) 20 MG tablet  2 times daily     09/12/18 2041    aluminum-magnesium hydroxide-simethicone (MAALOX) 200-200-20 MG/5ML SUSP  3 times daily before meals & bedtime     09/12/18 2041          Portions of this note  were generated with dragon dictation software. Dictation errors may occur despite best attempts at proofreading.   Sharman CheekStafford, Baylin Gamblin, MD 09/12/18 2044

## 2018-09-14 ENCOUNTER — Other Ambulatory Visit: Payer: Self-pay | Admitting: Gastroenterology

## 2018-09-14 DIAGNOSIS — R1319 Other dysphagia: Secondary | ICD-10-CM

## 2018-09-14 DIAGNOSIS — K920 Hematemesis: Secondary | ICD-10-CM

## 2018-09-14 DIAGNOSIS — R1111 Vomiting without nausea: Secondary | ICD-10-CM

## 2018-09-14 DIAGNOSIS — R1310 Dysphagia, unspecified: Secondary | ICD-10-CM

## 2018-09-17 ENCOUNTER — Other Ambulatory Visit: Payer: Self-pay

## 2018-09-17 ENCOUNTER — Ambulatory Visit
Admission: RE | Admit: 2018-09-17 | Discharge: 2018-09-17 | Disposition: A | Discharge status: Home / Self Care | Payer: BLUE CROSS/BLUE SHIELD | Source: Ambulatory Visit | Attending: Gastroenterology | Admitting: Gastroenterology

## 2018-09-17 DIAGNOSIS — R1310 Dysphagia, unspecified: Secondary | ICD-10-CM | POA: Diagnosis present

## 2018-09-17 DIAGNOSIS — K920 Hematemesis: Secondary | ICD-10-CM | POA: Diagnosis present

## 2018-09-17 DIAGNOSIS — R1111 Vomiting without nausea: Secondary | ICD-10-CM | POA: Diagnosis present

## 2018-09-17 DIAGNOSIS — R1319 Other dysphagia: Secondary | ICD-10-CM

## 2019-01-28 ENCOUNTER — Telehealth: Payer: Self-pay | Admitting: *Deleted

## 2019-01-28 DIAGNOSIS — Z20828 Contact with and (suspected) exposure to other viral communicable diseases: Secondary | ICD-10-CM

## 2019-01-28 DIAGNOSIS — Z20822 Contact with and (suspected) exposure to covid-19: Secondary | ICD-10-CM

## 2019-01-28 NOTE — Telephone Encounter (Signed)
Noted.  Were covid and red flag symptoms discussed with patient?  If syncope, confusion, blue lips/face, worsening breathing/shortness of breath to seek follow up/evaluation.  Did patient deny all covid symptoms e.g. fever, fatigue, muscle/body aches, headache, congestion or runny nose, sore throat, cough, nausea, vomiting, diarrhea, loss of taste smell, dyspnea/shortness of breath?

## 2019-01-28 NOTE — Telephone Encounter (Signed)
HR manager Tim reported pt called him and informed him that her daughter tested positive for Covid. Her sx started 11/18. Results 11/24. Recording pt's exposure date as 11/19. Pt remains asymptomatic. Recommended testing today for result turnaround time prior to scheduled return to work date of 02/01/19, but pt cannot make it there today. Advised to attend testing tomorrow 01/29/19 10a-3p. Prefers [place] site. Remain in car, wear mask. ER precautions reviewed. Advised not to return to work until clearance is received from clinic and/or HR. Pt verbalized understanding and agreement.

## 2019-01-29 ENCOUNTER — Other Ambulatory Visit: Payer: Self-pay

## 2019-01-29 DIAGNOSIS — Z20822 Contact with and (suspected) exposure to covid-19: Secondary | ICD-10-CM

## 2019-01-29 NOTE — Telephone Encounter (Signed)
Yes, specific ER/same day eval sx discussed as ShOB, unable to complete a full sentence, bluish tint to lips/face, confusion. Pt denied any current sx. Specifically asked about rhinorrhea, sinus pressure/pain, PND, sore throat, loss of taste or smell, cough, chest congestion, muscle aches, fever/chills, fatigue, GI sx. Advised pt to call clinic with any changes or start of sx. Will f/u with pt once results have returned.

## 2019-01-31 ENCOUNTER — Encounter: Payer: Self-pay | Admitting: *Deleted

## 2019-01-31 LAB — NOVEL CORONAVIRUS, NAA: SARS-CoV-2, NAA: NOT DETECTED

## 2019-01-31 NOTE — Telephone Encounter (Signed)
Covid PCR negative per Epic.  Please notify patient.  HR notified negative test.

## 2019-01-31 NOTE — Telephone Encounter (Signed)
HR notified me patient with positive test [facility] 01/23/2019.  Reviewed Epic Care everywhere confirmed test result and reviewed CDC guidelines patient to quarantine for 10 days after positive test when asymptomatic testing performed after close exposure.

## 2019-01-31 NOTE — Telephone Encounter (Signed)
Appears pt attended covid testing on 01/29/19. Results still pending.

## 2019-02-01 ENCOUNTER — Encounter: Payer: Self-pay | Admitting: *Deleted

## 2019-02-01 NOTE — Telephone Encounter (Signed)
Late entry: RN reviewing covid test results yesterday, 01/31/19. Through Care Everywhere noted that pt had a positive covid test on 01/23/19 through [facility]. Alerted HR manager to this and they were unaware of any previous testing having been performed. RN spoke with pt over phone and confirmed that she was tested and received positive result last week. Due to language barrier, could not determine why pt did not inform clinic or HR of this test, other than she stated that company told her she had to be tested so she went for 01/29/19 test. Spoke with HR manager after this and recommendation made to follow current CDC guidelines for asymptomatic positive person, 10 day quarantine after positive test. HR to call pt to alert her to new return to work date of 02/03/19.

## 2019-02-02 NOTE — Telephone Encounter (Signed)
Noted agree had discussed continued quarantine with HR Tim also 02/01/2019

## 2019-02-04 NOTE — Telephone Encounter (Signed)
Did patient return to work as expected yesterday? 

## 2019-02-14 NOTE — Telephone Encounter (Signed)
noted 

## 2019-02-14 NOTE — Telephone Encounter (Signed)
Confirmed with HR that pt returned to work on 02/03/19 as expected.

## 2019-05-27 ENCOUNTER — Ambulatory Visit: Payer: BLUE CROSS/BLUE SHIELD | Attending: Internal Medicine

## 2019-05-27 DIAGNOSIS — Z23 Encounter for immunization: Secondary | ICD-10-CM

## 2019-05-27 NOTE — Progress Notes (Signed)
   Covid-19 Vaccination Clinic  Name:  Joanne Peterson    MRN: [id number] DOB: 01-Jan-1981  05/27/2019  [name] was observed post Covid-19 immunization for 15 minutes without incident. She was provided with Vaccine Information Sheet and instruction to access the V-Safe system.   [name] was instructed to call 911 with any severe reactions post vaccine: Marland Kitchen Difficulty breathing  . Swelling of face and throat  . A fast heartbeat  . A bad rash all over body  . Dizziness and weakness   Immunizations Administered    Name Date Dose VIS Date Route   Pfizer COVID-19 Vaccine 05/27/2019  1:39 PM 0.3 mL 02/08/2019 Intramuscular   Manufacturer: [employer], [employer]   Lot: [id number]   NDC: [address]-7

## 2019-06-19 ENCOUNTER — Ambulatory Visit: Payer: BLUE CROSS/BLUE SHIELD | Attending: Internal Medicine

## 2019-06-19 DIAGNOSIS — Z23 Encounter for immunization: Secondary | ICD-10-CM

## 2019-06-19 NOTE — Progress Notes (Signed)
22  Covid-19 Vaccination Clinic  Name:  Joanne Peterson    MRN: [id number] DOB: Feb 09, 1981  06/19/2019  Joanne Peterson was observed post Covid-19 immunization for 15 minutes without incident. She was provided with Vaccine Information Sheet and instruction to access the V-Safe system.   Joanne Peterson was instructed to call 911 with any severe reactions post vaccine: Marland Kitchen Difficulty breathing  . Swelling of face and throat  . A fast heartbeat  . A bad rash all over body  . Dizziness and weakness   Immunizations Administered    Name Date Dose VIS Date Route   Pfizer COVID-19 Vaccine 06/19/2019 11:00 AM 0.3 mL 04/24/2018 Intramuscular   Manufacturer: [employer], [employer]   Lot: [id number]   NDC: [address]-9

## 2020-10-12 IMAGING — RF ESOPHAGUS/BARIUM SWALLOW/TABLET STUDY
11 of 13 series · 14 of 16 positions shown · non-contrast
Comparison: None.

CLINICAL DATA: Vomiting.  Hematemesis.  Pain.

EXAM:
UPPER GI SERIES WITHOUT KUB
TECHNIQUE: Routine upper GI series was performed with high density and thin
barium.
FLUOROSCOPY TIME:  Fluoroscopy Time:  2 minutes 42 seconds
Radiation Exposure Index (if provided by the fluoroscopic device):
68.4 mGy
Number of Acquired Spot Images: CT chest 05/27/2007.

[Series 1: fluoro_barium 2fps_bw · 0.18mm/px · 2 of 2 frames shown (1 of 11)]
[frame 1/2]
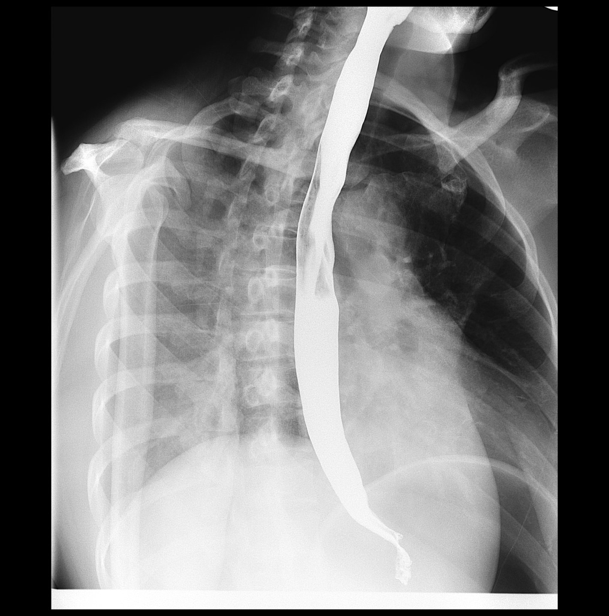
[frame 2/2]
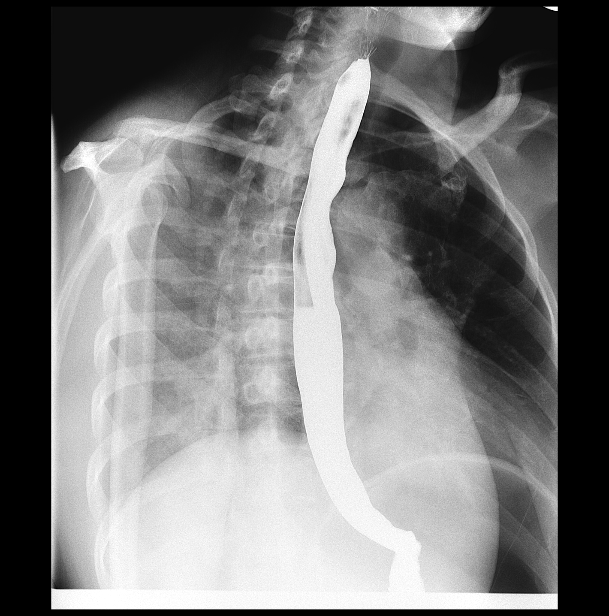

[Series 2: fluoro_barium 2fps_bw · 0.18mm/px · 1 of 1 slices shown (2 of 11)]
[im 1/1]
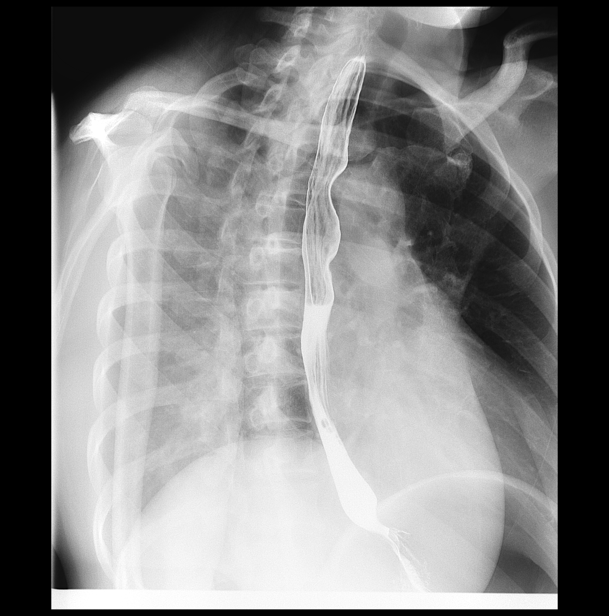

[Series 4: fluoro_barium 2fps_bw · 0.19mm/px · 2 of 2 frames shown (3 of 11)]
[frame 1/2]
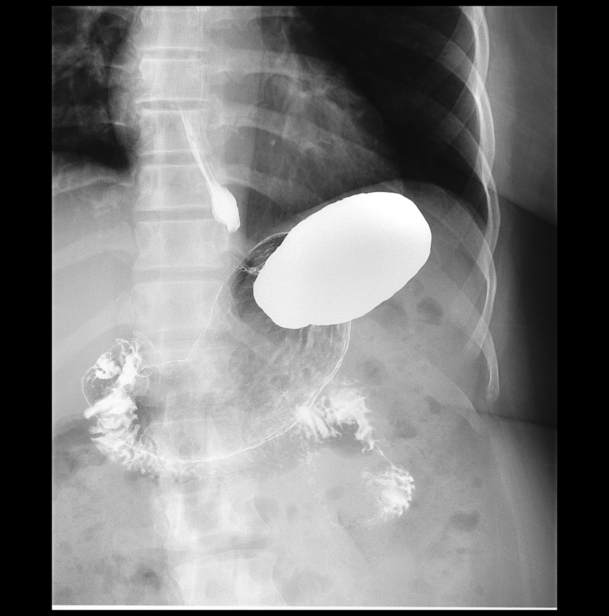
[frame 2/2]
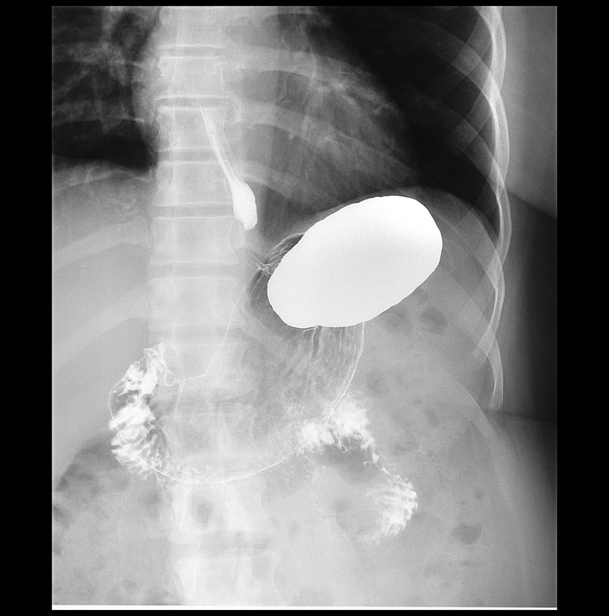

[Series 5: fluoro_barium 2fps_bw · 0.19mm/px · 1 of 1 slices shown (4 of 11)]
[im 1/1]
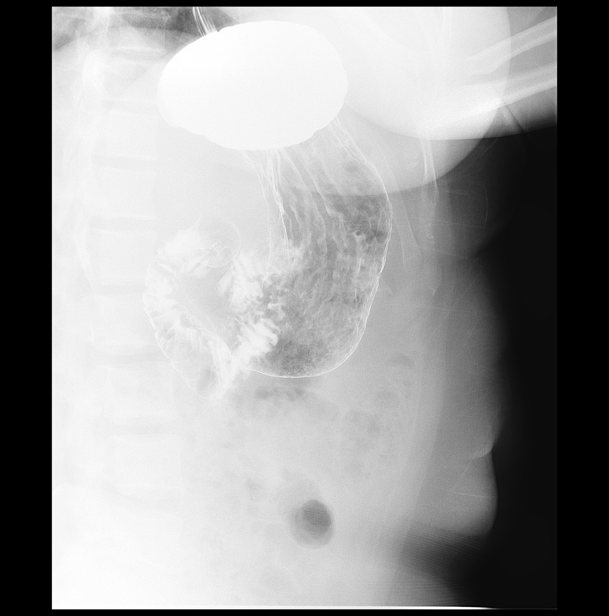

[Series 6: fluoro_barium 2fps_bw · 0.19mm/px · 1 of 1 slices shown (5 of 11)]
[im 1/1]
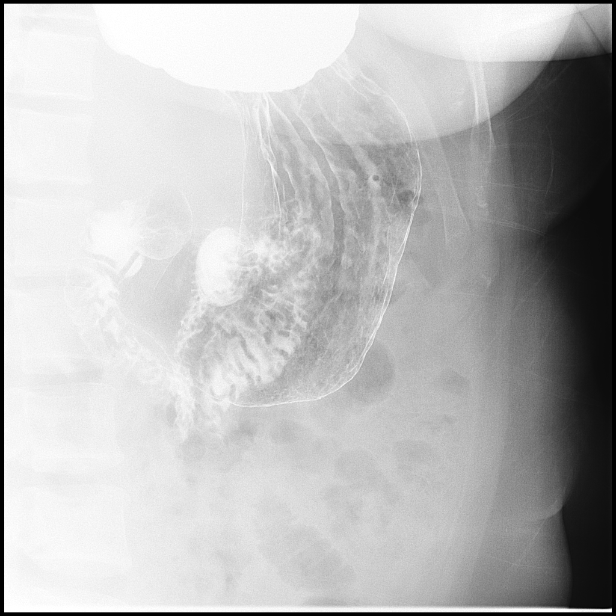

[Series 7: fluoro_barium 2fps_bw · 0.19mm/px · 1 of 1 slices shown (6 of 11)]
[im 1/1]
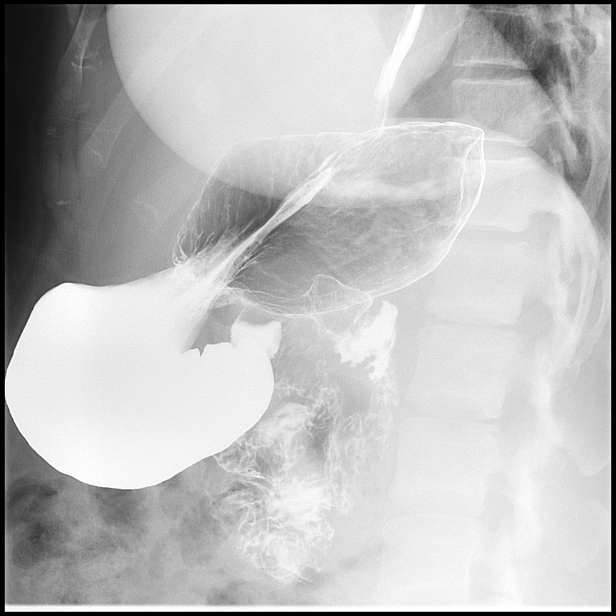

[Series 8: fluoro_barium 2fps_bw · 0.19mm/px · 1 of 1 slices shown (7 of 11)]
[im 1/1]
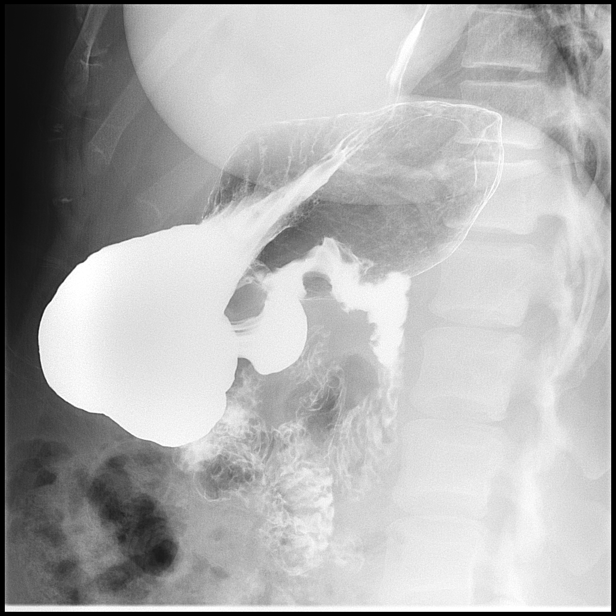

[Series 9: fluoro_barium 2fps_bw · 0.19mm/px · 1 of 1 slices shown (8 of 11)]
[im 1/1]
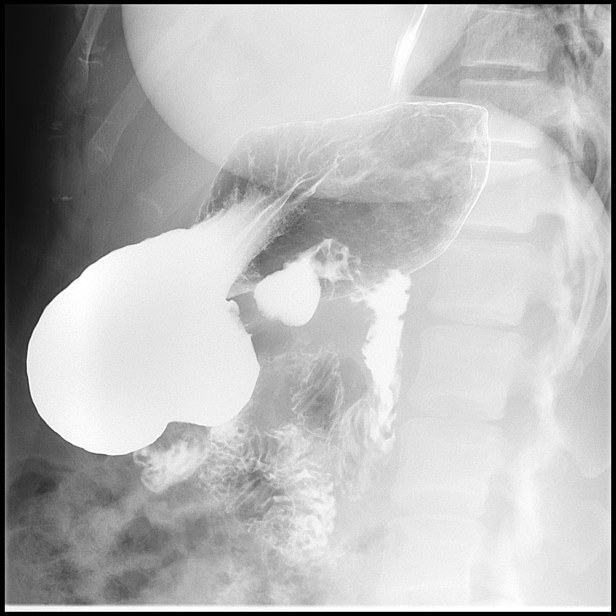

[Series 11: fluoro_barium 2fps_bw · 0.19mm/px · 1 of 1 slices shown (9 of 11)]
[im 1/1]
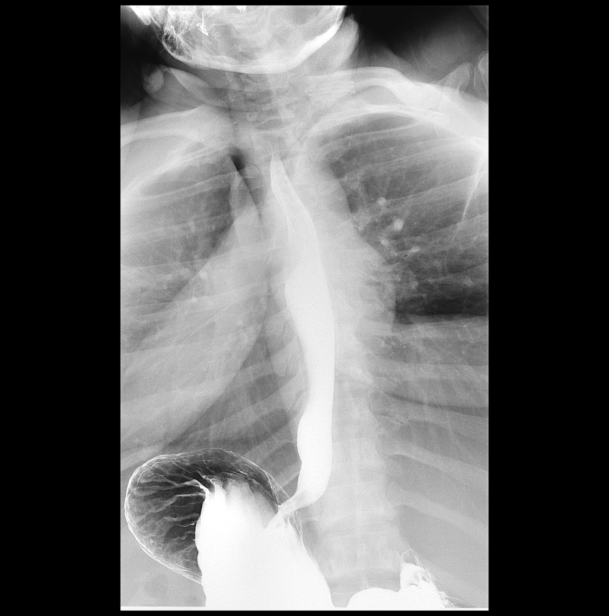

[Series 12: fluoro_barium 2fps_bw · 0.19mm/px · 2 of 2 frames shown (10 of 11)]
[frame 1/2]
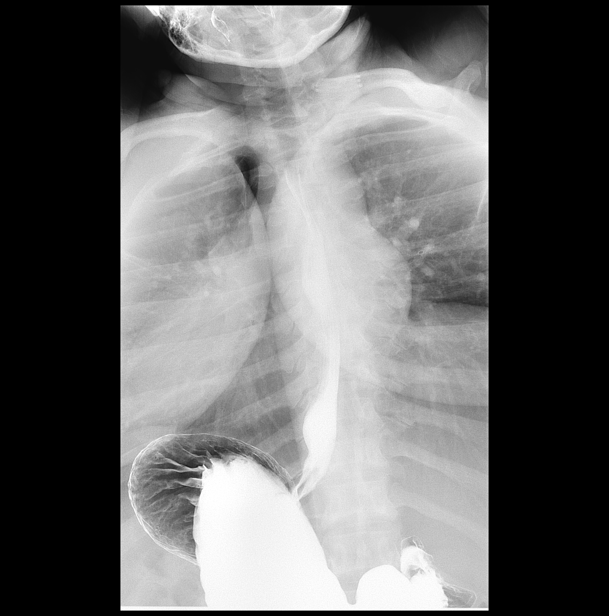
[frame 2/2]
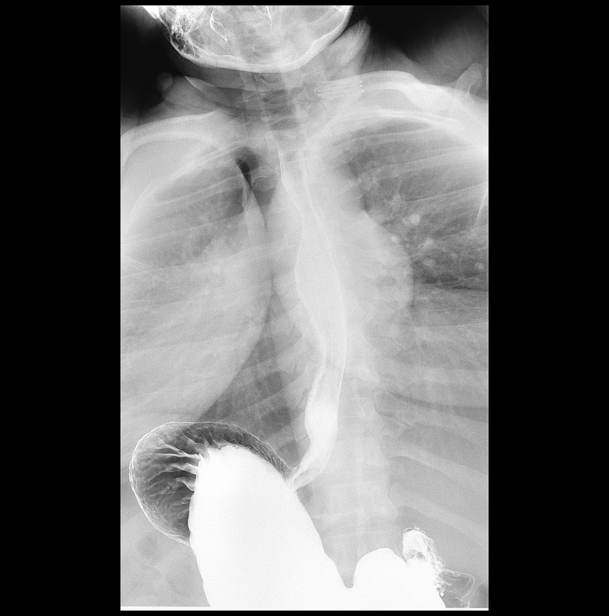

[Series 13: fluoro_barium 2fps_bw · 0.19mm/px · 1 of 1 slices shown (11 of 11)]
[im 1/1]
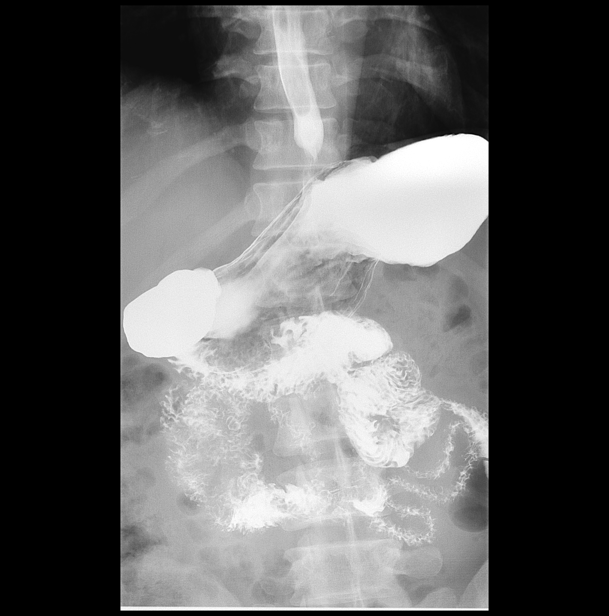

[14 of 16 positions shown; findings below may reference images not displayed]

FINDINGS: Esophagus is normal. Stomach appears normal. No focal gastric
abnormality identified. Duodenal bulb appears normal. Slight
deformity with mucosal thickening noted of the proximal/post bulbar
duodenum. This could be related to duodenitis. Visualized proximal
small bowel appears normal. No reflux.
IMPRESSION: Slight deformity with mucosal thickening noted the proximal/post
bulbar duodenum. Duodenitis cannot be excluded.

## 2021-04-22 ENCOUNTER — Ambulatory Visit: Payer: Self-pay | Admitting: Registered Nurse

## 2021-04-22 ENCOUNTER — Encounter: Payer: Self-pay | Admitting: Registered Nurse

## 2021-04-22 VITALS — BP 116/79 | HR 82 | Temp 97.9°F

## 2021-04-22 DIAGNOSIS — H748X1 Other specified disorders of right middle ear and mastoid: Secondary | ICD-10-CM

## 2021-04-22 DIAGNOSIS — H6691 Otitis media, unspecified, right ear: Secondary | ICD-10-CM

## 2021-04-22 MED ORDER — DOXYCYCLINE HYCLATE 100 MG PO TABS: 100.0000 mg | ORAL_TABLET | Freq: Two times a day (BID) | ORAL | 0 refills | Status: AC

## 2021-04-22 NOTE — Progress Notes (Signed)
Subjective:    Patient ID: Joanne Peterson, female    DOB: 1981-02-08, 41 y.o.   MRN: [id number]  40y/o established female pt c/o L ear pain. Seen one week ago by provider who doesn't utilize epic and started on Augmentin for ear infection. No improvement to date. Still hurting/decreased hearing.  Unsure if having any ear discharge.  Denied fever/chills.  Using tylenol OTC prn pain.     Review of Systems  Constitutional:  Negative for activity change, appetite change, chills, diaphoresis, fatigue and fever.  HENT:  Positive for ear pain and hearing loss. Negative for congestion, ear discharge, facial swelling, mouth sores, nosebleeds, postnasal drip, rhinorrhea, sinus pressure, sinus pain, sore throat, tinnitus, trouble swallowing and voice change.   Eyes:  Negative for photophobia and visual disturbance.  Respiratory:  Negative for cough, shortness of breath, wheezing and stridor.   Cardiovascular:  Negative for chest pain.  Gastrointestinal:  Negative for diarrhea, nausea and vomiting.  Endocrine: Negative for cold intolerance and heat intolerance.  Genitourinary:  Negative for difficulty urinating.  Musculoskeletal:  Negative for gait problem, neck pain and neck stiffness.  Skin:  Negative for color change, pallor, rash and wound.  Allergic/Immunologic: Negative for food allergies.  Neurological:  Negative for dizziness, tremors, seizures, syncope, facial asymmetry, speech difficulty, weakness, light-headedness, numbness and headaches.  Hematological:  Negative for adenopathy. Does not bruise/bleed easily.  Psychiatric/Behavioral:  Negative for agitation, confusion and sleep disturbance.       Objective:   Physical Exam Vitals and nursing note reviewed.  Constitutional:      General: She is awake. She is not in acute distress.    Appearance: Normal appearance. She is well-developed, well-groomed and overweight. She is not ill-appearing, toxic-appearing or diaphoretic.  HENT:      Head: Normocephalic and atraumatic.     Jaw: There is normal jaw occlusion. No trismus, tenderness, swelling or pain on movement.     Salivary Glands: Right salivary gland is not diffusely enlarged or tender. Left salivary gland is not diffusely enlarged or tender.     Right Ear: Hearing, ear canal and external ear normal. No decreased hearing noted. Drainage, swelling and tenderness present. No laceration. A middle ear effusion is present. There is no impacted cerumen. No foreign body. No mastoid tenderness. No PE tube. There is hemotympanum. Tympanic membrane is injected, erythematous and bulging.     Left Ear: Hearing, ear canal and external ear normal. No decreased hearing noted. No laceration, drainage, swelling or tenderness. A middle ear effusion is present. There is no impacted cerumen. No foreign body. No mastoid tenderness. No PE tube. No hemotympanum. Tympanic membrane is not injected, scarred, perforated, erythematous, retracted or bulging.     Ears:     Comments: Right canal and TM with erythema/edema/debris 6 oclock; air fluid level with opacity right; blood/erythema/injection TM; left air fluid level clear no erythema canal or TM    Nose: Mucosal edema, congestion and rhinorrhea present. No nasal deformity, septal deviation or laceration. Rhinorrhea is clear.     Right Turbinates: Not enlarged, swollen or pale.     Left Turbinates: Not enlarged, swollen or pale.     Right Sinus: No maxillary sinus tenderness or frontal sinus tenderness.     Left Sinus: No maxillary sinus tenderness or frontal sinus tenderness.     Mouth/Throat:     Lips: Pink. No lesions.     Mouth: Mucous membranes are moist. Mucous membranes are not pale, not dry and  not cyanotic. No lacerations, oral lesions or angioedema.     Dentition: No dental abscesses or gum lesions.     Tongue: No lesions. Tongue does not deviate from midline.     Palate: No mass and lesions.     Pharynx: Uvula midline. Pharyngeal swelling  and posterior oropharyngeal erythema present. No oropharyngeal exudate or uvula swelling.     Tonsils: No tonsillar exudate or tonsillar abscesses.     Comments: Cobblestoning posterior pharynx; bilateral allergic shiners;  Eyes:     General: Lids are normal. Vision grossly intact. Gaze aligned appropriately. Allergic shiner present. No scleral icterus.       Right eye: No foreign body, discharge or hordeolum.        Left eye: No foreign body, discharge or hordeolum.     Extraocular Movements:     Right eye: Normal extraocular motion and no nystagmus.     Left eye: Normal extraocular motion and no nystagmus.     Conjunctiva/sclera: Conjunctivae normal.     Right eye: Right conjunctiva is not injected. No chemosis, exudate or hemorrhage.    Left eye: Left conjunctiva is not injected. No chemosis, exudate or hemorrhage.    Pupils: Pupils are equal, round, and reactive to light. Pupils are equal.     Right eye: Pupil is round and reactive.     Left eye: Pupil is round and reactive.  Neck:     Thyroid: No thyroid mass or thyromegaly.     Trachea: Trachea and phonation normal. No tracheal tenderness or tracheal deviation.  Cardiovascular:     Rate and Rhythm: Normal rate and regular rhythm.     Pulses:          Radial pulses are 2+ on the right side and 2+ on the left side.     Heart sounds: Normal heart sounds, S1 normal and S2 normal.  Pulmonary:     Effort: Pulmonary effort is normal. No accessory muscle usage or respiratory distress.     Breath sounds: Normal breath sounds and air entry. No stridor or transmitted upper airway sounds. No decreased breath sounds, wheezing, rhonchi or rales.     Comments: Spoke full sentences without difficulty; no cough/congestion/throat clearing observed in exam room Chest:     Chest wall: No tenderness.  Abdominal:     General: There is no distension.     Palpations: Abdomen is soft.  Musculoskeletal:        General: No tenderness. Normal range of  motion.     Right shoulder: Normal.     Left shoulder: Normal.     Right hand: Normal.     Left hand: Normal.     Cervical back: Normal range of motion and neck supple. No swelling, edema, deformity, erythema, signs of trauma, lacerations, rigidity, tenderness or crepitus. No pain with movement. Normal range of motion.     Right hip: Normal.     Left hip: Normal.     Right knee: Normal.     Left knee: Normal.  Lymphadenopathy:     Head:     Right side of head: No submental, submandibular, tonsillar, preauricular, posterior auricular or occipital adenopathy.     Left side of head: No submental, submandibular, tonsillar, preauricular, posterior auricular or occipital adenopathy.     Cervical: No cervical adenopathy.     Right cervical: No superficial, deep or posterior cervical adenopathy.    Left cervical: No superficial, deep or posterior cervical adenopathy.  Skin:  General: Skin is warm and dry.     Capillary Refill: Capillary refill takes less than 2 seconds.     Coloration: Skin is not ashen, cyanotic, jaundiced, mottled, pale or sallow.     Findings: No abrasion, abscess, acne, bruising, burn, ecchymosis, erythema, signs of injury, laceration, lesion, petechiae, rash or wound.     Nails: There is no clubbing.  Neurological:     Mental Status: She is alert and oriented to person, place, and time. She is not disoriented.     GCS: GCS eye subscore is 4. GCS verbal subscore is 5. GCS motor subscore is 6.     Cranial Nerves: Cranial nerves 2-12 are intact. No cranial nerve deficit, dysarthria or facial asymmetry.     Sensory: Sensation is intact. No sensory deficit.     Motor: Motor function is intact. No weakness, tremor, atrophy, abnormal muscle tone or seizure activity.     Coordination: Coordination is intact. Coordination normal.     Gait: Gait is intact. Gait normal.     Comments: In/out of chair; on/off exam table without difficulty; gait sure and steady in clinic; bilateral  hand grasp equal 5/5  Psychiatric:        Attention and Perception: Attention and perception normal.        Mood and Affect: Mood and affect normal.        Speech: Speech normal.        Behavior: Behavior normal. Behavior is cooperative.        Thought Content: Thought content normal.        Cognition and Memory: Cognition and memory normal.        Judgment: Judgment normal.          Assessment & Plan:  A-right acute otitis media and hemotympanum  P-Supportive treatment. Continue Augmentin 875mg  po BID x 10 days previously dispensed to patient  Tylenol 1000mg  po QID prn pain/fever given 4 UD from clinic stock. If new discharge/fever/chills/worsening pain and no relief with tylenol 1000mg  po q6h start doxycycline 100mg  po BID dispensed to patient today from [facility] #20 RF0.  Discussed may sunburn easier while on doxycycline wear sunscreen/protective clothing hat/uv shirt.  Possible TM rupture discussed no submerging head under water lake/river/tub/pool.  Okay to shower.  Do not place anything in ears e.g. qtips/cotton applicators.  May clean outer ear with washcloth/soap/fingers.  Change pillowcase at home if discharge noted on pillowcase when she checks at home.  Check each morning if discharge noted while having ear pain.  Follow up in 2 weeks for re-evaluation sooner if new or worsening symptoms.   No evidence of invasive bacterial infection, non toxic and well hydrated.  This is most likely self limiting viral infection.  I do not see where any further testing or imaging is necessary at this time.   I will suggest supportive care, rest, good hygiene and encourage the patient to take adequate fluids.  The patient is to return to clinic or EMERGENCY ROOM if symptoms worsen or change significantly e.g. ear pain, fever, purulent discharge from ears or bleeding.  Exitcare handout on otitis media printed and given to patient.  Patient verbalized agreement and understanding of treatment plan and had no  further questions at this time.

## 2021-04-22 NOTE — Patient Instructions (Signed)

## 2021-12-16 ENCOUNTER — Encounter: Payer: Self-pay | Admitting: Registered Nurse

## 2021-12-16 ENCOUNTER — Ambulatory Visit: Payer: BLUE CROSS/BLUE SHIELD | Admitting: Registered Nurse

## 2021-12-16 VITALS — BP 125/80 | HR 78 | Temp 97.5°F

## 2021-12-16 DIAGNOSIS — H6692 Otitis media, unspecified, left ear: Secondary | ICD-10-CM

## 2021-12-16 MED ORDER — ACETAMINOPHEN 325 MG PO TABS: 650.0000 mg | ORAL_TABLET | Freq: Every day | ORAL | Status: AC | PRN

## 2021-12-16 MED ORDER — DOXYCYCLINE HYCLATE 100 MG PO TABS: 100.0000 mg | ORAL_TABLET | Freq: Two times a day (BID) | ORAL | 0 refills | Status: AC

## 2021-12-16 NOTE — Patient Instructions (Signed)

## 2021-12-16 NOTE — Progress Notes (Signed)
Subjective:    Patient ID: Joanne Peterson, female    DOB: January 04, 1981, 41 y.o.   MRN: [id number]  41y/o established patient here for left ear pain started two days ago worsening today.  Denied ear discharge/fever/chills/headache/rhinitis/post nasal drip/cough/n/v/d/trauma.  Last ear infection February required doxycycline after augmentin to clear symptoms/infection. Denied pregnancy.      Review of Systems  Constitutional:  Negative for activity change, appetite change, chills, diaphoresis and fever.  HENT:  Positive for ear pain. Negative for congestion, dental problem, drooling, ear discharge, facial swelling, hearing loss, mouth sores, nosebleeds, postnasal drip, rhinorrhea, sinus pressure, sinus pain, sneezing, sore throat, tinnitus, trouble swallowing and voice change.   Respiratory:  Negative for cough.   Cardiovascular:  Negative for chest pain.  Gastrointestinal:  Negative for diarrhea and vomiting.  Musculoskeletal:  Negative for gait problem, neck pain and neck stiffness.  Skin:  Negative for color change, rash and wound.  Neurological:  Negative for dizziness, tremors, syncope, speech difficulty, weakness and headaches.  Hematological:  Negative for adenopathy.  Psychiatric/Behavioral:  Negative for agitation, confusion and sleep disturbance.        Objective:   Physical Exam Vitals and nursing note reviewed.  Constitutional:      General: She is awake. She is not in acute distress.    Appearance: Normal appearance. She is well-developed and well-groomed. She is not ill-appearing, toxic-appearing or diaphoretic.  HENT:     Head: Normocephalic and atraumatic.     Jaw: There is normal jaw occlusion. No trismus.     Salivary Glands: Right salivary gland is not diffusely enlarged. Left salivary gland is not diffusely enlarged.     Right Ear: Hearing, ear canal and external ear normal. No decreased hearing noted. No laceration, drainage, swelling or tenderness. A middle ear  effusion is present. There is no impacted cerumen. No foreign body. No mastoid tenderness. No PE tube. No hemotympanum. Tympanic membrane is not injected, scarred, perforated, erythematous, retracted or bulging.     Left Ear: Hearing, ear canal and external ear normal. No decreased hearing noted. Swelling and tenderness present. No laceration or drainage. A middle ear effusion is present. There is no impacted cerumen. No foreign body. No mastoid tenderness. No PE tube. No hemotympanum. Tympanic membrane is injected and erythematous. Tympanic membrane is not scarred, perforated, retracted or bulging.     Ears:     Comments: Debris bilateral auditory canals gold cerumen flaky; bilateral TMs air fluid level clear; left auditory canal/TM erythema tenderness with otoscopic exam;     Nose: No nasal deformity, septal deviation, laceration, mucosal edema, congestion or rhinorrhea.     Right Turbinates: Not pale.     Left Turbinates: Not pale.     Right Sinus: No maxillary sinus tenderness or frontal sinus tenderness.     Left Sinus: No maxillary sinus tenderness or frontal sinus tenderness.     Mouth/Throat:     Lips: Pink. No lesions.     Mouth: Mucous membranes are moist. Mucous membranes are not pale, not dry and not cyanotic. No lacerations, oral lesions or angioedema.     Dentition: No dental abscesses or gum lesions.     Tongue: No lesions. Tongue does not deviate from midline.     Palate: No mass and lesions.     Pharynx: Uvula midline. Pharyngeal swelling and posterior oropharyngeal erythema present. No oropharyngeal exudate or uvula swelling.     Tonsils: No tonsillar exudate or tonsillar abscesses. 1+ on the right.  1+ on the left.     Comments: Cobblestoning posterior pharynx; bilateral allergic shiners; clear discharge bilateral nasal turbinates Eyes:     General: Lids are normal. Vision grossly intact. Gaze aligned appropriately. Allergic shiner present. No scleral icterus.       Right eye:  No foreign body, discharge or hordeolum.        Left eye: No foreign body, discharge or hordeolum.     Extraocular Movements:     Right eye: Normal extraocular motion and no nystagmus.     Left eye: Normal extraocular motion and no nystagmus.     Conjunctiva/sclera: Conjunctivae normal.     Right eye: Right conjunctiva is not injected. No chemosis, exudate or hemorrhage.    Left eye: Left conjunctiva is not injected. No chemosis, exudate or hemorrhage.    Pupils: Pupils are equal, round, and reactive to light. Pupils are equal.     Right eye: Pupil is round and reactive.     Left eye: Pupil is round and reactive.  Neck:     Thyroid: No thyroid mass or thyromegaly.     Trachea: Trachea and phonation normal. No tracheal tenderness or tracheal deviation.  Cardiovascular:     Rate and Rhythm: Normal rate and regular rhythm.     Pulses: Normal pulses.          Radial pulses are 2+ on the right side and 2+ on the left side.  Pulmonary:     Effort: Pulmonary effort is normal. No accessory muscle usage or respiratory distress.     Breath sounds: Normal breath sounds. No stridor. No decreased breath sounds, wheezing, rhonchi or rales.     Comments: No cough observed in clinic; spoke full sentences without difficulty Chest:     Chest wall: No tenderness.  Abdominal:     General: There is no distension.     Palpations: Abdomen is soft.  Musculoskeletal:        General: No tenderness.     Right hand: Normal strength. Normal capillary refill.     Left hand: Normal strength. Normal capillary refill.     Cervical back: Neck supple. No swelling, edema, deformity, erythema, signs of trauma, lacerations, rigidity, tenderness or crepitus. No pain with movement. Normal range of motion.     Thoracic back: No swelling, edema, deformity, signs of trauma or lacerations.     Right hip: Normal.     Left hip: Normal.     Right knee: Normal.     Left knee: Normal.  Lymphadenopathy:     Head:     Right  side of head: No submental, submandibular, tonsillar, preauricular, posterior auricular or occipital adenopathy.     Left side of head: No submental, submandibular, tonsillar, preauricular, posterior auricular or occipital adenopathy.     Cervical: No cervical adenopathy.     Right cervical: No superficial, deep or posterior cervical adenopathy.    Left cervical: No superficial, deep or posterior cervical adenopathy.  Skin:    General: Skin is warm and dry.     Capillary Refill: Capillary refill takes less than 2 seconds.     Coloration: Skin is not ashen, cyanotic, jaundiced, mottled, pale or sallow.     Findings: No abrasion, abscess, acne, bruising, burn, ecchymosis, erythema, signs of injury, laceration, lesion, petechiae, rash or wound.     Nails: There is no clubbing.  Neurological:     General: No focal deficit present.     Mental Status: She is alert  and oriented to person, place, and time. Mental status is at baseline. She is not disoriented.     GCS: GCS eye subscore is 4. GCS verbal subscore is 5. GCS motor subscore is 6.     Cranial Nerves: Cranial nerves 2-12 are intact. No cranial nerve deficit, dysarthria or facial asymmetry.     Sensory: Sensation is intact. No sensory deficit.     Motor: Motor function is intact. No weakness, tremor, atrophy, abnormal muscle tone or seizure activity.     Coordination: Coordination is intact. Coordination normal.     Gait: Gait is intact. Gait normal.     Comments: On/off exam table and in/out of chair without difficulty; gait sure and steady in clinic; bilateral hand grasp equal 5/5  Psychiatric:        Attention and Perception: Attention and perception normal.        Mood and Affect: Mood and affect normal.        Speech: Speech normal.        Behavior: Behavior normal. Behavior is cooperative.        Thought Content: Thought content normal.        Cognition and Memory: Cognition and memory normal.        Judgment: Judgment normal.           Assessment & Plan:  A-acute left otitis media  P-Augmentin did not previously work for patient Feb 2023.  Otitis resolved with doxycycline. Supportive treatment.  Doxycycline 100mg  po BID x 10 days #20 RF0 dispensed from [facility] to patient  Tylenol 1000mg  po QID prn pain/fever given 4 UD from clinic stock for pain today.   No evidence of invasive bacterial infection, non toxic and well hydrated.  This is most likely self limiting viral infection.  I do not see where any further testing or imaging is necessary at this time.   I will suggest supportive care, rest, good hygiene and encourage the patient to take adequate fluids.  The patient is to return to clinic or EMERGENCY ROOM if symptoms worsen or change significantly e.g. ear pain, fever, purulent discharge from ears or bleeding.  Exitcare handout on otitis media printed and given to patient   Patient verbalized agreement and understanding of treatment plan and had no further questions at this time.

## 2022-01-27 ENCOUNTER — Encounter: Payer: Self-pay | Admitting: Registered Nurse

## 2022-01-27 ENCOUNTER — Ambulatory Visit: Payer: Self-pay | Admitting: Registered Nurse

## 2022-01-27 VITALS — BP 117/81 | HR 80 | Temp 97.3°F

## 2022-01-28 MED ORDER — AMOXICILLIN-POT CLAVULANATE 875-125 MG PO TABS: 1.0000 | ORAL_TABLET | Freq: Two times a day (BID) | ORAL | 0 refills | Status: AC

## 2022-01-28 MED ORDER — IBUPROFEN 800 MG PO TABS: 800.0000 mg | ORAL_TABLET | Freq: Three times a day (TID) | ORAL | 0 refills | Status: DC

## 2022-01-28 MED ORDER — ACETAMINOPHEN 500 MG PO TABS: 1000.0000 mg | ORAL_TABLET | Freq: Four times a day (QID) | ORAL | 0 refills | Status: AC | PRN

## 2022-01-28 NOTE — Patient Instructions (Signed)
Eustachian Tube Dysfunction  Eustachian tube dysfunction refers to a condition in which a blockage develops in the narrow passage that connects the middle ear to the back of the nose (eustachian tube). The eustachian tube regulates air pressure in the middle ear by letting air move between the ear and nose. It also helps to drain fluid from the middle ear space. Eustachian tube dysfunction can affect one or both ears. When the eustachian tube does not function properly, air pressure, fluid, or both can build up in the middle ear. What are the causes? This condition occurs when the eustachian tube becomes blocked or cannot open normally. Common causes of this condition include: Ear infections. Colds and other infections that affect the nose, mouth, and throat (upper respiratory tract). Allergies. Irritation from cigarette smoke. Irritation from stomach acid coming up into the esophagus (gastroesophageal reflux). The esophagus is the part of the body that moves food from the mouth to the stomach. Sudden changes in air pressure, such as from descending in an airplane or scuba diving. Abnormal growths in the nose or throat, such as: Growths that line the nose (nasal polyps). Abnormal growth of cells (tumors). Enlarged tissue at the back of the throat (adenoids). What increases the risk? You are more likely to develop this condition if: You smoke. You are overweight. You are a child who has: Certain birth defects of the mouth, such as cleft palate. Large tonsils or adenoids. What are the signs or symptoms? Common symptoms of this condition include: A feeling of fullness in the ear. Ear pain. Clicking or popping noises in the ear. Ringing in the ear (tinnitus). Hearing loss. Loss of balance. Dizziness. Symptoms may get worse when the air pressure around you changes, such as when you travel to an area of high elevation, fly on an airplane, or go scuba diving. How is this diagnosed? This  condition may be diagnosed based on: Your symptoms. A physical exam of your ears, nose, and throat. Tests, such as those that measure: The movement of your eardrum. Your hearing (audiometry). How is this treated? Treatment depends on the cause and severity of your condition. In mild cases, you may relieve your symptoms by moving air into your ears. This is called "popping the ears." In more severe cases, or if you have symptoms of fluid in your ears, treatment may include: Medicines to relieve congestion (decongestants). Medicines that treat allergies (antihistamines). Nasal sprays or ear drops that contain medicines that reduce swelling (steroids). A procedure to drain the fluid in your eardrum. In this procedure, a small tube may be placed in the eardrum to: Drain the fluid. Restore the air in the middle ear space. A procedure to insert a balloon device through the nose to inflate the opening of the eustachian tube (balloon dilation). Follow these instructions at home: Lifestyle Do not do any of the following until your health care provider approves: Travel to high altitudes. Fly in airplanes. Work in a pressurized cabin or room. Scuba dive. Do not use any products that contain nicotine or tobacco. These products include cigarettes, chewing tobacco, and vaping devices, such as e-cigarettes. If you need help quitting, ask your health care provider. Keep your ears dry. Wear fitted earplugs during showering and bathing. Dry your ears completely after. General instructions Take over-the-counter and prescription medicines only as told by your health care provider. Use techniques to help pop your ears as recommended by your health care provider. These may include: Chewing gum. Yawning. Frequent, forceful swallowing.   Closing your mouth, holding your nose closed, and gently blowing as if you are trying to blow air out of your nose. Keep all follow-up visits. This is important. Contact a  health care provider if: Your symptoms do not go away after treatment. Your symptoms come back after treatment. You are unable to pop your ears. You have: A fever. Pain in your ear. Pain in your head or neck. Fluid draining from your ear. Your hearing suddenly changes. You become very dizzy. You lose your balance. Get help right away if: You have a sudden, severe increase in any of your symptoms. Summary Eustachian tube dysfunction refers to a condition in which a blockage develops in the eustachian tube. It can be caused by ear infections, allergies, inhaled irritants, or abnormal growths in the nose or throat. Symptoms may include ear pain or fullness, hearing loss, or ringing in the ears. Mild cases are treated with techniques to unblock the ears, such as yawning or chewing gum. More severe cases are treated with medicines or procedures. This information is not intended to replace advice given to you by your health care provider. Make sure you discuss any questions you have with your health care provider. Document Revised: 04/27/2020 Document Reviewed: 04/27/2020 Elsevier Patient Education  [place]. Otitis Media, Adult  Otitis media occurs when there is inflammation and fluid in the middle ear with signs and symptoms of an acute infection. The middle ear is a part of the ear that contains bones for hearing as well as air that helps send sounds to the brain. When infected fluid builds up in this space, it causes pressure and can lead to an ear infection. The eustachian tube connects the middle ear to the back of the nose (nasopharynx) and normally allows air into the middle ear. If the eustachian tube becomes blocked, fluid can build up and become infected. What are the causes? This condition is caused by a blockage in the eustachian tube. This can be caused by mucus or by swelling of the tube. Problems that can cause a blockage include: A cold or other upper respiratory  infection. Allergies. An irritant, such as tobacco smoke. Enlarged adenoids. The adenoids are areas of soft tissue located high in the back of the throat, behind the nose and the roof of the mouth. They are part of the body's defense system (immune system). A mass in the nasopharynx. Damage to the ear caused by pressure changes (barotrauma). What increases the risk? You are more likely to develop this condition if you: Smoke or are exposed to tobacco smoke. Have an opening in the roof of your mouth (cleft palate). Have gastroesophageal reflux. Have an immune system disorder. What are the signs or symptoms? Symptoms of this condition include: Ear pain. Fever. Decreased hearing. Tiredness (lethargy). Fluid leaking from the ear, if the eardrum is ruptured or has burst. Ringing in the ear. How is this diagnosed?  This condition is diagnosed with a physical exam. During the exam, your health care provider will use an instrument called an otoscope to look in your ear and check for redness, swelling, and fluid. He or she will also ask about your symptoms. Your health care provider may also order tests, such as: A pneumatic otoscopy. This is a test to check the movement of the eardrum. It is done by squeezing a small amount of air into the ear. A tympanogram. This is a test that shows how well the eardrum moves in response to air pressure  in the ear canal. It provides a graph for your health care provider to review. How is this treated? This condition can go away on its own within 3-5 days. But if the condition is caused by a bacterial infection and does not go away on its own, or if it keeps coming back, your health care provider may: Prescribe antibiotic medicine to treat the infection. Prescribe or recommend medicines to control pain. Follow these instructions at home: Take over-the-counter and prescription medicines only as told by your health care provider. If you were prescribed an  antibiotic medicine, take it as told by your health care provider. Do not stop taking the antibiotic even if you start to feel better. Keep all follow-up visits. This is important. Contact a health care provider if: You have bleeding from your nose. There is a lump on your neck. You are not feeling better in 5 days. You feel worse instead of better. Get help right away if: You have severe pain that is not controlled with medicine. You have swelling, redness, or pain around your ear. You have stiffness in your neck. A part of your face is not moving (paralyzed). The bone behind your ear (mastoid bone) is tender when you touch it. You develop a severe headache. Summary Otitis media is redness, soreness, and swelling of the middle ear, usually resulting in pain and decreased hearing. This condition can go away on its own within 3-5 days. If the problem does not go away in 3-5 days, your health care provider may give you medicines to treat the infection. If you were prescribed an antibiotic medicine, take it as told by your health care provider. Follow all instructions that were given to you by your health care provider. This information is not intended to replace advice given to you by your health care provider. Make sure you discuss any questions you have with your health care provider. Document Revised: 05/25/2020 Document Reviewed: 05/25/2020 Elsevier Patient Education  2023 [employer]  

## 2022-01-28 NOTE — Progress Notes (Signed)
Subjective:    Patient ID: Joanne Peterson, female    DOB: 1980-12-19, 41 y.o.   MRN: [id number]  41y/o established female patient here for evaluation right ear pain worsening.  Denied fever/chills/discharge.  Used q-tips in ears bilaterally yesterday as itching also.  Some nasal congestion.  Home covid test negative today.  Not using allergy nose sprays flonase or azelastin at this time.      Review of Systems  Constitutional:  Negative for activity change, appetite change, chills, diaphoresis, fatigue and fever.  HENT:  Positive for congestion, ear pain, postnasal drip and rhinorrhea. Negative for ear discharge, facial swelling, hearing loss, mouth sores, nosebleeds, sinus pressure, sinus pain, sneezing, sore throat, tinnitus, trouble swallowing and voice change.   Eyes:  Negative for photophobia, pain, discharge, redness, itching and visual disturbance.  Respiratory:  Negative for cough, shortness of breath, wheezing and stridor.   Cardiovascular:  Negative for chest pain and palpitations.  Gastrointestinal:  Negative for diarrhea, nausea and vomiting.  Endocrine: Negative for cold intolerance and heat intolerance.  Genitourinary:  Negative for difficulty urinating.  Musculoskeletal:  Negative for gait problem, neck pain and neck stiffness.  Skin:  Negative for rash.  Allergic/Immunologic: Positive for environmental allergies. Negative for food allergies.  Neurological:  Negative for dizziness, tremors, seizures, syncope, facial asymmetry, speech difficulty, weakness, light-headedness, numbness and headaches.  Psychiatric/Behavioral:  Negative for agitation, confusion and sleep disturbance.        Objective:   Physical Exam Vitals and nursing note reviewed.  Constitutional:      General: She is awake. She is not in acute distress.    Appearance: Normal appearance. She is well-developed, well-groomed and overweight. She is not ill-appearing, toxic-appearing or diaphoretic.  HENT:      Head: Normocephalic and atraumatic.     Jaw: There is normal jaw occlusion. No trismus.     Salivary Glands: Right salivary gland is not diffusely enlarged or tender. Left salivary gland is not diffusely enlarged or tender.     Right Ear: Hearing, ear canal and external ear normal. No decreased hearing noted. No laceration, drainage, swelling or tenderness. A middle ear effusion is present. There is no impacted cerumen. No foreign body. No mastoid tenderness. No PE tube. No hemotympanum. Tympanic membrane is bulging. Tympanic membrane is not injected, scarred, perforated, erythematous or retracted.     Left Ear: Hearing, ear canal and external ear normal. No decreased hearing noted. No laceration, drainage, swelling or tenderness. A middle ear effusion is present. There is no impacted cerumen. No foreign body. No mastoid tenderness. No PE tube. No hemotympanum. Tympanic membrane is erythematous and bulging. Tympanic membrane is not injected, scarred, perforated or retracted.     Ears:     Comments: Bilateral auditory canals with dry flaking skin; left canal with erythema;air fluid level bilateral TMs intact; left some scattered opacity; right clear; mild erythema 6 oclock right auditory canal no erythema right TM    Nose: Mucosal edema, congestion and rhinorrhea present. No nasal deformity, septal deviation or laceration. Rhinorrhea is clear.     Right Turbinates: Enlarged and swollen. Not pale.     Left Turbinates: Enlarged and swollen. Not pale.     Right Sinus: No maxillary sinus tenderness or frontal sinus tenderness.     Left Sinus: No maxillary sinus tenderness or frontal sinus tenderness.     Mouth/Throat:     Lips: Pink. No lesions.     Mouth: Mucous membranes are moist. Mucous membranes  are not pale, not dry and not cyanotic. No lacerations, oral lesions or angioedema.     Dentition: No dental abscesses or gum lesions.     Pharynx: Uvula midline. Pharyngeal swelling and posterior  oropharyngeal erythema present. No oropharyngeal exudate or uvula swelling.     Tonsils: No tonsillar exudate or tonsillar abscesses.     Comments: Cobblestoning posterior pharynx; bilateral allergic shiners; clear discharge bilateral nasal turbinates edema erythema Eyes:     General: Lids are normal. Vision grossly intact. Gaze aligned appropriately. Allergic shiner present. No scleral icterus.       Right eye: No foreign body, discharge or hordeolum.        Left eye: No foreign body, discharge or hordeolum.     Extraocular Movements: Extraocular movements intact.     Right eye: Normal extraocular motion and no nystagmus.     Left eye: Normal extraocular motion and no nystagmus.     Conjunctiva/sclera: Conjunctivae normal.     Right eye: Right conjunctiva is not injected. No chemosis, exudate or hemorrhage.    Left eye: Left conjunctiva is not injected. No chemosis, exudate or hemorrhage.    Pupils: Pupils are equal, round, and reactive to light. Pupils are equal.     Right eye: Pupil is round and reactive.     Left eye: Pupil is round and reactive.  Neck:     Thyroid: No thyroid mass or thyromegaly.     Trachea: Trachea and phonation normal. No tracheal tenderness or tracheal deviation.  Cardiovascular:     Rate and Rhythm: Normal rate and regular rhythm.     Pulses: Normal pulses.          Radial pulses are 2+ on the right side and 2+ on the left side.     Heart sounds: Normal heart sounds, S1 normal and S2 normal. No murmur heard.    No friction rub.  Pulmonary:     Effort: Pulmonary effort is normal. No accessory muscle usage or respiratory distress.     Breath sounds: Normal breath sounds and air entry. No stridor, decreased air movement or transmitted upper airway sounds. No decreased breath sounds, wheezing, rhonchi or rales.     Comments: Spoke full sentences without difficulty; no cough observed in clinic Chest:     Chest wall: No tenderness.  Abdominal:     General: There  is no distension.     Palpations: Abdomen is soft.  Musculoskeletal:        General: Normal range of motion.     Right hand: Normal strength. Normal capillary refill.     Left hand: Normal strength. Normal capillary refill.     Cervical back: Normal range of motion and neck supple. No swelling, edema, deformity, erythema, signs of trauma, lacerations, rigidity, torticollis, tenderness or crepitus. No pain with movement. Normal range of motion.     Thoracic back: No swelling, edema, deformity, signs of trauma, lacerations, spasms or tenderness.     Right hip: Normal.     Left hip: Normal.     Right knee: Normal.     Left knee: Normal.  Lymphadenopathy:     Head:     Right side of head: No submental, submandibular, tonsillar, preauricular, posterior auricular or occipital adenopathy.     Left side of head: No submental, submandibular, tonsillar, preauricular, posterior auricular or occipital adenopathy.     Cervical: No cervical adenopathy.     Right cervical: No superficial, deep or posterior cervical adenopathy.  Left cervical: No superficial, deep or posterior cervical adenopathy.  Skin:    General: Skin is warm and dry.     Capillary Refill: Capillary refill takes less than 2 seconds.     Coloration: Skin is not ashen, cyanotic, jaundiced, mottled, pale or sallow.     Findings: Rash present. No abrasion, abscess, acne, bruising, burn, ecchymosis, erythema, signs of injury, laceration, lesion, petechiae or wound. Rash is macular and scaling.     Nails: There is no clubbing.     Comments: Bilateral auditory canals; macular erythema  Neurological:     Mental Status: She is alert and oriented to person, place, and time. She is not disoriented.     GCS: GCS eye subscore is 4. GCS verbal subscore is 5. GCS motor subscore is 6.     Cranial Nerves: Cranial nerves 2-12 are intact. No cranial nerve deficit, dysarthria or facial asymmetry.     Sensory: Sensation is intact. No sensory deficit.      Motor: Motor function is intact. No weakness, tremor, atrophy, abnormal muscle tone or seizure activity.     Coordination: Coordination is intact. Coordination normal.     Gait: Gait is intact. Gait normal.     Comments: On/off exam table and in/out of chair without difficulty; gait sure and steady in clinic; bilateral hand grasp equal 5/5  Psychiatric:        Attention and Perception: Attention and perception normal.        Mood and Affect: Mood and affect normal.        Speech: Speech normal.        Behavior: Behavior normal. Behavior is cooperative.        Thought Content: Thought content normal.        Cognition and Memory: Cognition and memory normal.        Judgment: Judgment normal.           Assessment & Plan:   A-left acute otitis media; right eustachian tube dysfunction  P-Supportive treatment. Augmentin 875mg  po BID x 10 days #20 RF0 dispensed from [facility] to patient  Tylenol 1000mg  po QID prn pain/fever or ibuprofen 800mg  po TID prn pain take with food given 4 UD each from clinic stock..   No evidence of invasive bacterial infection, non toxic and well hydrated.  This is most likely self limiting viral infection.  I do not see where any further testing or imaging is necessary at this time.   I will suggest supportive care, rest, good hygiene and encourage the patient to take adequate fluids.  The patient is to return to clinic or EMERGENCY ROOM if symptoms worsen or change significantly e.g. ear pain, fever, purulent discharge from ears or bleeding.  Exitcare handout on otitis media  Patient stated she would follow up in 2 weeks for ear recheck and going to start mineral oil or olive oil 5 gtts each canal daily for dry skin.  Avoid q tips in ears.   Patient verbalized agreement and understanding of treatment plan and had no further questions at this time.   Cold weather vasomotor rhinitis verus viral URI post nasal drip/rhinitis discussed circulating in community  covid/RSV/adenovirus/flu.  Discussed may use flonase/nasal saline and/or dayquil for all except cold vasomotor rhinitis  .No evidence of invasive bacterial infection, non toxic and well hydrated.  I do not see where any further testing or imaging is necessary at this time.   I will suggest supportive care, rest, good hygiene and encourage  the patient to take adequate fluids.  The patient is to return to clinic or EMERGENCY ROOM if symptoms worsen or change significantly e.g. ear pain, fever, purulent discharge from ears or bleeding.  Exitcare handout on otitis media and eustachian tube dysfunction.  Discussed with patient post nasal drip irritates throat/causes swelling blocks eustachian tubes from draining and fluid fills up middle ear.  Bacteria/viruses can grow in fluid and with moving head tube compressed and increases pressure in tube/ear worsening pain.  Studies show will take 30 days for fluid to resolve after post nasal drip controlled with nasal steroid/antihistamine. Antibiotics and steroids do not speed up fluid removal.  Patient verbalized agreement and understanding of treatment plan and had no further questions at this time.

## 2022-02-02 ENCOUNTER — Ambulatory Visit: Payer: Self-pay | Admitting: Occupational Medicine

## 2022-02-02 DIAGNOSIS — R197 Diarrhea, unspecified: Secondary | ICD-10-CM

## 2022-02-02 NOTE — Progress Notes (Signed)
Gi upset noted started Sunday. Diarrhea mild on abt for ear infection. Ear infection improving. Told patient gi issues common side effect of medication. Drink plenty of water and finish all abts.

## 2022-02-04 ENCOUNTER — Encounter: Payer: Self-pay | Admitting: Registered Nurse

## 2022-02-04 ENCOUNTER — Telehealth: Payer: Self-pay | Admitting: Registered Nurse

## 2022-02-04 DIAGNOSIS — H6692 Otitis media, unspecified, left ear: Secondary | ICD-10-CM

## 2022-02-04 NOTE — Telephone Encounter (Signed)
-----   Message from Dagoberto Ligas, RN sent at 02/02/2022 11:38 AM EST ----- Regarding: Gi upset on abts Gi upset noted started Sunday. Diarrhea mild on abt for ear infection. Ear infection improving. Told patient gi issues common side effect of medication. Drink plenty of water and finish all abts.

## 2022-02-04 NOTE — Telephone Encounter (Signed)
Patient contacted via telephone stated only having diarrhea after eating.  Still taking augmentin.  Ear pain has resolved started use 01/28/22.  Discussed with patient can stop medication if ear pain returns this weekend restart medication and follow up with me in clinic on 02/08/22.  Patient denied diarrhea throughout the day only after meals.  Denied dizziness/feeling weak.  Patient A&Ox3 spoke full sentences without difficulty.  No audible cough/throat clearing/congestion during 3 minute call.  Patient verbalized understanding information/instructions, agreed with plan of care and had no further questions at this time.

## 2022-02-10 ENCOUNTER — Encounter: Payer: Self-pay | Admitting: Registered Nurse

## 2022-02-10 ENCOUNTER — Ambulatory Visit: Payer: Self-pay | Admitting: Registered Nurse

## 2022-02-10 VITALS — HR 88 | Temp 97.0°F

## 2022-02-10 DIAGNOSIS — H6993 Unspecified Eustachian tube disorder, bilateral: Secondary | ICD-10-CM

## 2022-02-10 NOTE — Patient Instructions (Signed)
Eustachian Tube Dysfunction  Eustachian tube dysfunction refers to a condition in which a blockage develops in the narrow passage that connects the middle ear to the back of the nose (eustachian tube). The eustachian tube regulates air pressure in the middle ear by letting air move between the ear and nose. It also helps to drain fluid from the middle ear space. Eustachian tube dysfunction can affect one or both ears. When the eustachian tube does not function properly, air pressure, fluid, or both can build up in the middle ear. What are the causes? This condition occurs when the eustachian tube becomes blocked or cannot open normally. Common causes of this condition include: Ear infections. Colds and other infections that affect the nose, mouth, and throat (upper respiratory tract). Allergies. Irritation from cigarette smoke. Irritation from stomach acid coming up into the esophagus (gastroesophageal reflux). The esophagus is the part of the body that moves food from the mouth to the stomach. Sudden changes in air pressure, such as from descending in an airplane or scuba diving. Abnormal growths in the nose or throat, such as: Growths that line the nose (nasal polyps). Abnormal growth of cells (tumors). Enlarged tissue at the back of the throat (adenoids). What increases the risk? You are more likely to develop this condition if: You smoke. You are overweight. You are a child who has: Certain birth defects of the mouth, such as cleft palate. Large tonsils or adenoids. What are the signs or symptoms? Common symptoms of this condition include: A feeling of fullness in the ear. Ear pain. Clicking or popping noises in the ear. Ringing in the ear (tinnitus). Hearing loss. Loss of balance. Dizziness. Symptoms may get worse when the air pressure around you changes, such as when you travel to an area of high elevation, fly on an airplane, or go scuba diving. How is this diagnosed? This  condition may be diagnosed based on: Your symptoms. A physical exam of your ears, nose, and throat. Tests, such as those that measure: The movement of your eardrum. Your hearing (audiometry). How is this treated? Treatment depends on the cause and severity of your condition. In mild cases, you may relieve your symptoms by moving air into your ears. This is called "popping the ears." In more severe cases, or if you have symptoms of fluid in your ears, treatment may include: Medicines to relieve congestion (decongestants). Medicines that treat allergies (antihistamines). Nasal sprays or ear drops that contain medicines that reduce swelling (steroids). A procedure to drain the fluid in your eardrum. In this procedure, a small tube may be placed in the eardrum to: Drain the fluid. Restore the air in the middle ear space. A procedure to insert a balloon device through the nose to inflate the opening of the eustachian tube (balloon dilation). Follow these instructions at home: Lifestyle Do not do any of the following until your health care provider approves: Travel to high altitudes. Fly in airplanes. Work in a pressurized cabin or room. Scuba dive. Do not use any products that contain nicotine or tobacco. These products include cigarettes, chewing tobacco, and vaping devices, such as e-cigarettes. If you need help quitting, ask your health care provider. Keep your ears dry. Wear fitted earplugs during showering and bathing. Dry your ears completely after. General instructions Take over-the-counter and prescription medicines only as told by your health care provider. Use techniques to help pop your ears as recommended by your health care provider. These may include: Chewing gum. Yawning. Frequent, forceful swallowing.   Closing your mouth, holding your nose closed, and gently blowing as if you are trying to blow air out of your nose. Keep all follow-up visits. This is important. Contact a  health care provider if: Your symptoms do not go away after treatment. Your symptoms come back after treatment. You are unable to pop your ears. You have: A fever. Pain in your ear. Pain in your head or neck. Fluid draining from your ear. Your hearing suddenly changes. You become very dizzy. You lose your balance. Get help right away if: You have a sudden, severe increase in any of your symptoms. Summary Eustachian tube dysfunction refers to a condition in which a blockage develops in the eustachian tube. It can be caused by ear infections, allergies, inhaled irritants, or abnormal growths in the nose or throat. Symptoms may include ear pain or fullness, hearing loss, or ringing in the ears. Mild cases are treated with techniques to unblock the ears, such as yawning or chewing gum. More severe cases are treated with medicines or procedures. This information is not intended to replace advice given to you by your health care provider. Make sure you discuss any questions you have with your health care provider. Document Revised: 04/27/2020 Document Reviewed: 04/27/2020 Elsevier Patient Education  2023 [employer]  

## 2022-02-10 NOTE — Progress Notes (Signed)
Subjective:    Patient ID: Joanne Peterson, female    DOB: [birth year], 41 y.o.   MRN: [id number]  41y/o established female patient here for ear re-evaluation occasional popping/pressure.  Ear pain sharp resolved.  Denied fever/chills/headache/ear discharge/n/v/d.  Last seen 01/27/22 started on augmentin for otitis media developed loose stools that resolved once medication stopped.      Review of Systems  Constitutional:  Negative for activity change, appetite change, chills, diaphoresis, fatigue and fever.  HENT:  Positive for ear pain. Negative for congestion, ear discharge, facial swelling and hearing loss.   Eyes:  Negative for photophobia and visual disturbance.  Respiratory:  Negative for cough, shortness of breath, wheezing and stridor.   Cardiovascular:  Negative for chest pain.  Gastrointestinal:  Negative for abdominal pain, blood in stool, diarrhea and vomiting.  Genitourinary:  Negative for difficulty urinating.  Musculoskeletal:  Negative for gait problem, neck pain and neck stiffness.  Allergic/Immunologic: Negative for food allergies.  Neurological:  Negative for dizziness, tremors, seizures, syncope, facial asymmetry, speech difficulty, weakness, light-headedness, numbness and headaches.  Hematological:  Negative for adenopathy. Does not bruise/bleed easily.  Psychiatric/Behavioral:  Negative for agitation, confusion and sleep disturbance.        Objective:   Physical Exam Vitals and nursing note reviewed.  Constitutional:      General: She is awake. She is not in acute distress.    Appearance: Normal appearance. She is well-developed, well-groomed and overweight. She is not ill-appearing, toxic-appearing or diaphoretic.  HENT:     Head: Normocephalic and atraumatic.     Jaw: There is normal jaw occlusion. No trismus.     Salivary Glands: Right salivary gland is not diffusely enlarged or tender. Left salivary gland is not diffusely enlarged or tender.     Right Ear:  Hearing, ear canal and external ear normal. No decreased hearing noted. No laceration, drainage, swelling or tenderness. A middle ear effusion is present. There is no impacted cerumen. No foreign body. No mastoid tenderness. No PE tube. No hemotympanum. Tympanic membrane is not injected, scarred, perforated, erythematous or bulging.     Left Ear: Hearing, ear canal and external ear normal. No decreased hearing noted. No laceration, drainage, swelling or tenderness. A middle ear effusion is present. There is no impacted cerumen. No foreign body. No mastoid tenderness. No PE tube. No hemotympanum. Tympanic membrane is not injected, scarred, perforated, erythematous or bulging.     Ears:     Comments: Air fluid level clear bilateral TMs scattered cerumen in auditory canals; cobblestoning posterior pharynx; bilateral allergic shiners    Nose: Mucosal edema and rhinorrhea present. No nasal deformity, septal deviation, signs of injury, laceration, nasal tenderness or congestion. Rhinorrhea is clear.     Right Turbinates: Not enlarged, swollen or pale.     Left Turbinates: Not enlarged, swollen or pale.     Right Sinus: No maxillary sinus tenderness or frontal sinus tenderness.     Left Sinus: No maxillary sinus tenderness or frontal sinus tenderness.     Mouth/Throat:     Lips: Pink. No lesions.     Mouth: Mucous membranes are moist. Mucous membranes are not pale, not dry and not cyanotic. No lacerations, oral lesions or angioedema.     Dentition: No dental abscesses or gum lesions.     Pharynx: Uvula midline. Pharyngeal swelling and posterior oropharyngeal erythema present. No oropharyngeal exudate or uvula swelling.     Tonsils: No tonsillar exudate or tonsillar abscesses. 0 on the  right. 0 on the left.     Comments: Cobblestoning posterior pharynx Eyes:     General: Lids are normal. Vision grossly intact. Gaze aligned appropriately. Allergic shiner present. No scleral icterus.       Right eye: No  foreign body, discharge or hordeolum.        Left eye: No foreign body, discharge or hordeolum.     Extraocular Movements: Extraocular movements intact.     Right eye: Normal extraocular motion and no nystagmus.     Left eye: Normal extraocular motion and no nystagmus.     Conjunctiva/sclera: Conjunctivae normal.     Right eye: Right conjunctiva is not injected. No chemosis, exudate or hemorrhage.    Left eye: Left conjunctiva is not injected. No chemosis, exudate or hemorrhage.    Pupils: Pupils are equal, round, and reactive to light. Pupils are equal.     Right eye: Pupil is round and reactive.     Left eye: Pupil is round and reactive.  Neck:     Thyroid: No thyroid mass or thyromegaly.     Trachea: Trachea and phonation normal. No tracheal tenderness or tracheal deviation.  Cardiovascular:     Rate and Rhythm: Normal rate and regular rhythm.     Pulses:          Radial pulses are 2+ on the right side and 2+ on the left side.     Heart sounds: Normal heart sounds, S1 normal and S2 normal.  Pulmonary:     Effort: Pulmonary effort is normal. No accessory muscle usage or respiratory distress.     Breath sounds: Normal breath sounds and air entry. No stridor, decreased air movement or transmitted upper airway sounds. No decreased breath sounds, wheezing, rhonchi or rales.     Comments: Spoke full sentences without difficulty; no cough observed in exam room Chest:     Chest wall: No tenderness.  Abdominal:     General: There is no distension.     Palpations: Abdomen is soft.  Musculoskeletal:        General: No tenderness. Normal range of motion.     Right hand: Normal strength. Normal capillary refill.     Left hand: Normal strength. Normal capillary refill.     Cervical back: Normal range of motion and neck supple. No swelling, edema, deformity, erythema, signs of trauma, lacerations, rigidity, tenderness or crepitus. Normal range of motion.     Thoracic back: No swelling, edema,  deformity, signs of trauma, lacerations or tenderness.     Right hip: Normal.     Left hip: Normal.     Right knee: Normal.     Left knee: Normal.  Lymphadenopathy:     Head:     Right side of head: No submental, submandibular, tonsillar, preauricular, posterior auricular or occipital adenopathy.     Left side of head: No submental, submandibular, tonsillar, preauricular, posterior auricular or occipital adenopathy.     Cervical: No cervical adenopathy.     Right cervical: No superficial, deep or posterior cervical adenopathy.    Left cervical: No superficial, deep or posterior cervical adenopathy.  Skin:    General: Skin is warm and dry.     Capillary Refill: Capillary refill takes less than 2 seconds.     Coloration: Skin is not ashen, cyanotic, jaundiced, mottled, pale or sallow.     Findings: No abrasion, abscess, acne, bruising, burn, ecchymosis, erythema, signs of injury, laceration, lesion, petechiae, rash or wound.  Nails: There is no clubbing.  Neurological:     Mental Status: She is alert and oriented to person, place, and time. She is not disoriented.     GCS: GCS eye subscore is 4. GCS verbal subscore is 5. GCS motor subscore is 6.     Cranial Nerves: Cranial nerves 2-12 are intact. No cranial nerve deficit, dysarthria or facial asymmetry.     Sensory: Sensation is intact. No sensory deficit.     Motor: Motor function is intact. No weakness, tremor, atrophy, abnormal muscle tone or seizure activity.     Coordination: Coordination is intact. Coordination normal.     Gait: Gait is intact. Gait normal.     Comments: In/out of chair and on/off exam table without difficulty; gait sure and steady in clinic; bilateral hand grasp equal 5/5  Psychiatric:        Attention and Perception: Attention and perception normal.        Mood and Affect: Mood and affect normal.        Speech: Speech normal.        Behavior: Behavior normal. Behavior is cooperative.        Thought Content:  Thought content normal.        Cognition and Memory: Cognition and memory normal.        Judgment: Judgment normal.           Assessment & Plan:   A-eustachian tube dysfunction bilateral  P- No evidence of invasive bacterial infection, non toxic and well hydrated.  I do not see where any further testing or imaging is necessary at this time.   I will suggest supportive care, rest, good hygiene and encourage the patient to take adequate fluids.  The patient is to return to clinic or EMERGENCY ROOM if symptoms worsen or change significantly e.g. ear pain, fever, purulent discharge from ears or bleeding.  Use nasal saline 2 sprays each nostril q2h prn congestion; OTC antihistamine of her choice if dust causing itching eyes/nose/sneezing and consider flonase nasal 1 spray each nostril BID prn congestion/rhinitis.  Discussed cold weather rhinitis will not be stopped by medication cover nose/mouth with scarf or mask when outdoors. Discussed with patient post nasal drip irritates throat/causes swelling blocks eustachian tubes from draining and fluid fills up middle ear.  Bacteria/viruses can grow in fluid and with moving head tube compressed and increases pressure in tube/ear worsening pain.  Studies show will take 30 days for fluid to resolve after post nasal drip controlled with nasal steroid/antihistamine. Antibiotics and steroids do not speed up fluid removal.  Patient verbalized agreement and understanding of treatment plan and had no further questions at this time.

## 2022-04-11 ENCOUNTER — Ambulatory Visit: Payer: Self-pay | Admitting: Occupational Medicine

## 2022-04-11 VITALS — BP 122/82

## 2022-04-11 DIAGNOSIS — H938X2 Other specified disorders of left ear: Secondary | ICD-10-CM

## 2022-04-11 NOTE — Progress Notes (Signed)
Patient came in with left ear pressure and HA taking tylenol. Notice fluid in ear patient reports taking clartin. Covid test negative yesterday. Occasional cough. Recommend to see PCP or urgent care since not on company insurance.

## 2022-08-26 ENCOUNTER — Other Ambulatory Visit: Payer: Self-pay | Admitting: Family

## 2022-08-26 DIAGNOSIS — E782 Mixed hyperlipidemia: Secondary | ICD-10-CM

## 2023-08-10 ENCOUNTER — Encounter: Payer: Self-pay | Admitting: Registered Nurse

## 2023-08-10 ENCOUNTER — Ambulatory Visit: Payer: Self-pay | Admitting: Registered Nurse

## 2023-08-10 VITALS — BP 130/80 | HR 70 | Temp 97.2°F

## 2023-08-10 DIAGNOSIS — H60502 Unspecified acute noninfective otitis externa, left ear: Secondary | ICD-10-CM

## 2023-08-10 DIAGNOSIS — H6993 Unspecified Eustachian tube disorder, bilateral: Secondary | ICD-10-CM

## 2023-08-10 MED ORDER — NEOMYCIN-POLYMYXIN-HC 3.5-10000-1 OT SOLN: 4.0000 [drp] | Freq: Four times a day (QID) | OTIC | 0 refills | Status: AC

## 2023-08-10 NOTE — Progress Notes (Signed)
C/O left ear pain 

## 2023-08-10 NOTE — Progress Notes (Signed)
 Established Patient Office Visit  Subjective   Patient ID: Joanne Peterson, female    DOB: 1980/06/12  Age: 43 y.o. MRN: [id number]  Chief Complaint  Patient presents with   Pain    Left ear x 2 days    42y/o established female here for evaluation left ear pain x 2 days.  Itchy denied discharge/bleeding/fever/chills.  Has noticed some post nasal drip today  Has not home covid tested      Review of Systems  Constitutional:  Negative for chills and fever.  HENT:  Positive for ear pain. Negative for congestion, ear discharge, hearing loss, nosebleeds, sinus pain, sore throat and tinnitus.   Respiratory:  Negative for cough and stridor.   Cardiovascular:  Negative for chest pain.  Gastrointestinal:  Negative for diarrhea, nausea and vomiting.  Musculoskeletal:  Negative for myalgias and neck pain.  Neurological:  Negative for dizziness and headaches.  Psychiatric/Behavioral:  The patient does not have insomnia.       Objective:     BP 130/80 (BP Location: Left Arm, Patient Position: Sitting, Cuff Size: Large)   Pulse 70   Temp (!) 97.2 F (36.2 C) (Tympanic)   SpO2 97%    Physical Exam Vitals and nursing note reviewed.  Constitutional:      General: She is awake. She is not in acute distress.    Appearance: Normal appearance. She is well-developed, well-groomed and overweight. She is not ill-appearing, toxic-appearing or diaphoretic.  HENT:     Head: Normocephalic and atraumatic.     Jaw: There is normal jaw occlusion.     Salivary Glands: Right salivary gland is not diffusely enlarged or tender. Left salivary gland is not diffusely enlarged or tender.     Right Ear: Hearing and external ear normal. No decreased hearing noted. No laceration, drainage, swelling or tenderness. A middle ear effusion is present. There is no impacted cerumen. No foreign body. No mastoid tenderness. No PE tube. No hemotympanum. Tympanic membrane is not injected, scarred, perforated,  erythematous, retracted or bulging.     Left Ear: Hearing and external ear normal. No decreased hearing noted. Swelling and tenderness present. No laceration. A middle ear effusion is present. There is no impacted cerumen. No foreign body. No mastoid tenderness. No PE tube. No hemotympanum. Tympanic membrane is not injected, scarred, perforated, erythematous, retracted or bulging.     Ears:     Comments: Clear yellow crumbly material noted auditory canal left swollen erythematous tender canal left only; bilateral TMs intact air fluid level clear; bilateral allergic shiners; cobblestoning posterior pharynx; nasal sniffing observed in exam room;     Nose: Nose normal. No congestion or rhinorrhea.     Right Nostril: No epistaxis.     Left Nostril: No epistaxis.     Right Turbinates: Not enlarged, swollen or pale.     Left Turbinates: Not enlarged, swollen or pale.     Right Sinus: No maxillary sinus tenderness or frontal sinus tenderness.     Left Sinus: No maxillary sinus tenderness or frontal sinus tenderness.     Mouth/Throat:     Lips: Pink. No lesions.     Mouth: Mucous membranes are moist. No oral lesions or angioedema.     Dentition: No gum lesions.     Tongue: No lesions. Tongue does not deviate from midline.     Palate: No mass and lesions.     Pharynx: Uvula midline. Pharyngeal swelling and postnasal drip present. No oropharyngeal exudate, posterior oropharyngeal  erythema or uvula swelling.     Tonsils: No tonsillar exudate.   Eyes:     General: Lids are normal. Vision grossly intact. Gaze aligned appropriately. Allergic shiner present. No scleral icterus.       Right eye: No discharge.        Left eye: No discharge.     Extraocular Movements: Extraocular movements intact.     Conjunctiva/sclera: Conjunctivae normal.     Pupils: Pupils are equal, round, and reactive to light.   Neck:     Trachea: Trachea and phonation normal. No tracheal tenderness or abnormal tracheal secretions.    Cardiovascular:     Rate and Rhythm: Normal rate and regular rhythm.     Pulses:          Radial pulses are 2+ on the right side and 2+ on the left side.     Heart sounds: Normal heart sounds, S1 normal and S2 normal.  Pulmonary:     Effort: Pulmonary effort is normal.     Breath sounds: Normal breath sounds and air entry. No stridor or transmitted upper airway sounds. No decreased breath sounds, wheezing, rhonchi or rales.     Comments: Spoke full sentences without difficulty; no cough observed in exam room Abdominal:     General: Abdomen is flat.   Musculoskeletal:        General: Normal range of motion.     Right hand: Normal strength. Normal capillary refill.     Left hand: Normal strength. Normal capillary refill.     Cervical back: Normal range of motion and neck supple. No swelling, edema, deformity, erythema, signs of trauma, lacerations, rigidity, spasms, torticollis, tenderness or crepitus. No pain with movement or muscular tenderness. Normal range of motion.     Thoracic back: No swelling, edema, deformity, signs of trauma, lacerations, spasms or tenderness. Normal range of motion.  Lymphadenopathy:     Head:     Right side of head: No submental, submandibular, tonsillar, preauricular, posterior auricular or occipital adenopathy.     Left side of head: No submental, submandibular, tonsillar, preauricular, posterior auricular or occipital adenopathy.     Cervical: No cervical adenopathy.     Right cervical: No superficial, deep or posterior cervical adenopathy.    Left cervical: No superficial, deep or posterior cervical adenopathy.   Skin:    General: Skin is warm and dry.     Capillary Refill: Capillary refill takes less than 2 seconds.     Coloration: Skin is not ashen, cyanotic, jaundiced, mottled, pale or sallow.     Findings: No abrasion, abscess, acne, bruising, burn, ecchymosis, erythema, signs of injury, laceration, lesion, petechiae, rash or wound.     Nails:  There is no clubbing.   Neurological:     General: No focal deficit present.     Mental Status: She is alert and oriented to person, place, and time. Mental status is at baseline.     GCS: GCS eye subscore is 4. GCS verbal subscore is 5. GCS motor subscore is 6.     Cranial Nerves: Cranial nerves 2-12 are intact. No cranial nerve deficit, dysarthria or facial asymmetry.     Sensory: Sensation is intact.     Motor: Motor function is intact. No weakness, tremor, atrophy, abnormal muscle tone or seizure activity.     Coordination: Coordination is intact. Coordination normal.     Gait: Gait is intact. Gait normal.     Comments: In/out of chair and on/off exam  table without difficulty; gait sure and steady in clinic; bilateral hand grasp equal 5/5  Psychiatric:        Attention and Perception: Attention and perception normal.        Mood and Affect: Mood and affect normal.        Speech: Speech normal.        Behavior: Behavior normal. Behavior is cooperative.        Thought Content: Thought content normal.        Cognition and Memory: Cognition and memory normal.        Judgment: Judgment normal.      No results found for any visits on 08/10/23.    The 10-year ASCVD risk score (Arnett DK, et al., 2019) is: 0.4%    Assessment & Plan:   Problem List Items Addressed This Visit   None Visit Diagnoses       Acute otitis externa of left ear, unspecified type    -  Primary     Electronic rx neosporin/polymyxin/hydrocortisone 4 gtts left ear QID x 10 days #25ml RF0 sent electronically to her pharmacy of choice.  May take tylenol  1000mg  po q6h prn pain or ibuprofen  800mg  po q8h prn pain take with food.  Avoid scratching in ear or putting qtips/cotton applicators/pens in ear to scratch.  Follow up if no improvement with 48 hours drop use for re-evaluation or if new worsening symptoms e.g. discharge/loss of hearing/bleeding/worsening pain/fever/chills.   No evidence of invasive bacterial  infection, non toxic and well hydrated.  I do not see where any further testing or imaging is necessary at this time.   I will suggest supportive care, rest, good hygiene and encourage the patient to take adequate fluids.  The patient is to return to clinic or EMERGENCY ROOM if symptoms worsen or change significantly e.g. ear pain, fever, purulent discharge from ears or bleeding.   Discussed with patient post nasal drip irritates throat/causes swelling blocks eustachian tubes from draining and fluid fills up middle ear.  Bacteria/viruses can grow in fluid and with moving head tube compressed and increases pressure in tube/ear worsening pain.  Studies show will take 30 days for fluid to resolve after post nasal drip controlled with nasal steroid/antihistamine. Antibiotics and steroids do not speed up fluid removal.  Patient verbalized agreement and understanding of treatment plan and had no further questions at this time.   Return if symptoms worsen or fail to improve.    Richardine Chancy, NP

## 2023-08-10 NOTE — Patient Instructions (Addendum)
 Otitis Externa  Otitis externa is an infection of the outer ear canal. The outer ear canal is the area between the outside of the ear and the eardrum. Otitis externa is sometimes called swimmer's ear. What are the causes? Common causes of this condition include: Swimming in dirty water. Moisture in the ear. An injury to the inside of the ear. An object stuck in the ear. A cut or scrape on the outside of the ear or in the ear canal. What increases the risk? You are more likely to develop this condition if you go swimming often. What are the signs or symptoms? The first symptom of this condition is often itching in the ear. Later symptoms of the condition include: Swelling of the ear. Redness in the ear. Ear pain. The pain may get worse when you pull on your ear. Pus coming from the ear. How is this diagnosed? This condition may be diagnosed by examining the ear and testing fluid from the ear for bacteria and funguses. How is this treated? This condition may be treated with: Antibiotic ear drops. These are often given for 10-14 days. Medicines to reduce itching and swelling. Follow these instructions at home: If you were prescribed antibiotic ear drops, use them as told by your health care provider. Do not stop using the antibiotic even if you start to feel better. Take over-the-counter and prescription medicines only as told by your health care provider. Avoid getting water in your ears as told by your health care provider. This may include avoiding swimming or water sports for a few days. Keep all follow-up visits. This is important. How is this prevented? Keep your ears dry. Use the corner of a towel to dry your ears after you swim or bathe. Avoid scratching or putting things in your ear. Doing these things can damage the ear canal or remove the protective wax that lines it, which makes it easier for bacteria and funguses to grow. Avoid swimming in lakes, polluted water, or swimming  pools that may not have enough chlorine. Contact a health care provider if: You have a fever. Your ear is still red, swollen, painful, or draining pus after 3 days. Your redness, swelling, or pain gets worse. You have a severe headache. Get help right away if: You have redness, swelling, and pain or tenderness in the area behind your ear. Summary Otitis externa is an infection of the outer ear canal. Common causes include swimming in dirty water, moisture in the ear, or a cut or scrape in the ear. Symptoms include pain, redness, and swelling of the ear canal. If you were prescribed antibiotic ear drops, use them as told by your health care provider. Do not stop using the antibiotic even if you start to feel better. This information is not intended to replace advice given to you by your health care provider. Make sure you discuss any questions you have with your health care provider. Document Revised: 04/29/2020 Document Reviewed: 04/29/2020 [employer] Patient Education  2024 [employer]Eustachian Tube Dysfunction  Eustachian tube dysfunction refers to a condition in which a blockage develops in the narrow passage that connects the middle ear to the back of the nose (eustachian tube). The eustachian tube regulates air pressure in the middle ear by letting air move between the ear and nose. It also helps to drain fluid from the middle ear space. Eustachian tube dysfunction can affect one or both ears. When the eustachian tube does not function properly, air pressure, fluid, or both  can build up in the middle ear. What are the causes? This condition occurs when the eustachian tube becomes blocked or cannot open normally. Common causes of this condition include: Ear infections. Colds and other infections that affect the nose, mouth, and throat (upper respiratory tract). Allergies. Irritation from cigarette smoke. Irritation from stomach acid coming up into the esophagus (gastroesophageal  reflux). The esophagus is the part of the body that moves food from the mouth to the stomach. Sudden changes in air pressure, such as from descending in an airplane or scuba diving. Abnormal growths in the nose or throat, such as: Growths that line the nose (nasal polyps). Abnormal growth of cells (tumors). Enlarged tissue at the back of the throat (adenoids). What increases the risk? You are more likely to develop this condition if: You smoke. You are overweight. You are a child who has: Certain birth defects of the mouth, such as cleft palate. Large tonsils or adenoids. What are the signs or symptoms? Common symptoms of this condition include: A feeling of fullness in the ear. Ear pain. Clicking or popping noises in the ear. Ringing in the ear (tinnitus). Hearing loss. Loss of balance. Dizziness. Symptoms may get worse when the air pressure around you changes, such as when you travel to an area of high elevation, fly on an airplane, or go scuba diving. How is this diagnosed? This condition may be diagnosed based on: Your symptoms. A physical exam of your ears, nose, and throat. Tests, such as those that measure: The movement of your eardrum. Your hearing (audiometry). How is this treated? Treatment depends on the cause and severity of your condition. In mild cases, you may relieve your symptoms by moving air into your ears. This is called popping the ears. In more severe cases, or if you have symptoms of fluid in your ears, treatment may include: Medicines to relieve congestion (decongestants). Medicines that treat allergies (antihistamines). Nasal sprays or ear drops that contain medicines that reduce swelling (steroids). A procedure to drain the fluid in your eardrum. In this procedure, a small tube may be placed in the eardrum to: Drain the fluid. Restore the air in the middle ear space. A procedure to insert a balloon device through the nose to inflate the opening of  the eustachian tube (balloon dilation). Follow these instructions at home: Lifestyle Do not do any of the following until your health care provider approves: Travel to high altitudes. Fly in airplanes. Work in a Estate agent or room. Scuba dive. Do not use any products that contain nicotine or tobacco. These products include cigarettes, chewing tobacco, and vaping devices, such as e-cigarettes. If you need help quitting, ask your health care provider. Keep your ears dry. Wear fitted earplugs during showering and bathing. Dry your ears completely after. General instructions Take over-the-counter and prescription medicines only as told by your health care provider. Use techniques to help pop your ears as recommended by your health care provider. These may include: Chewing gum. Yawning. Frequent, forceful swallowing. Closing your mouth, holding your nose closed, and gently blowing as if you are trying to blow air out of your nose. Keep all follow-up visits. This is important. Contact a health care provider if: Your symptoms do not go away after treatment. Your symptoms come back after treatment. You are unable to pop your ears. You have: A fever. Pain in your ear. Pain in your head or neck. Fluid draining from your ear. Your hearing suddenly changes. You become very dizzy. You  lose your balance. Get help right away if: You have a sudden, severe increase in any of your symptoms. Summary Eustachian tube dysfunction refers to a condition in which a blockage develops in the eustachian tube. It can be caused by ear infections, allergies, inhaled irritants, or abnormal growths in the nose or throat. Symptoms may include ear pain or fullness, hearing loss, or ringing in the ears. Mild cases are treated with techniques to unblock the ears, such as yawning or chewing gum. More severe cases are treated with medicines or procedures. This information is not intended to replace advice  given to you by your health care provider. Make sure you discuss any questions you have with your health care provider. Document Revised: 04/27/2020 Document Reviewed: 04/27/2020 Elsevier Patient Education  2024 [employer].

## 2023-10-17 ENCOUNTER — Telehealth: Payer: Self-pay | Admitting: Registered Nurse

## 2023-10-17 ENCOUNTER — Encounter: Payer: Self-pay | Admitting: Registered Nurse

## 2023-10-17 DIAGNOSIS — J302 Other seasonal allergic rhinitis: Secondary | ICD-10-CM

## 2023-10-17 DIAGNOSIS — H6993 Unspecified Eustachian tube disorder, bilateral: Secondary | ICD-10-CM

## 2023-10-17 MED ORDER — LORATADINE 10 MG PO TABS: 10.0000 mg | ORAL_TABLET | Freq: Every day | ORAL | Status: AC

## 2023-10-17 NOTE — Telephone Encounter (Signed)
 Patient with history of ear pain/eustachian tube dysfunction and allergies.  Otoscopic evaluation in clinic today with bilateral TMs intact air fluid level clear no discharge/debris/cerumen in auditory canals.  No erythema/swelling.  Patient reported mild discomfort with otoscopic exam  bilateral allergic shiners cobblestoning posterior pharynx mild erythema and swelling  no enlarged cervical/preauricular/occipital/submental lymph nodes sclera clear BBS CTA skin warm dry and pink gait sure and steady in clinic.  In/out of chair without difficulty. Patient had BP check with RN Apolinar.  Discussed restart claritin  10mg  po daily given 4 UD from clinic stock as post nasal drip noted on exam today.  Discussed no irritation/swelling or infection seen in bilateral ears today.  Patient may use normal saline nasal spray 2 sprays each nostril q2h wa as needed. flonase 50mcg 1 spray each nostril BID OTC Patient denied personal or family history of ENT cancer.  OTC antihistamine of choice claritin /zyrtec 10mg  po daily.  Avoid triggers if possible.  Shower prior to bedtime if exposed to triggers.  If allergic dust/dust mites recommend mattress/pillow covers/encasements; washing linens, vacuuming, sweeping, dusting weekly.  Call or return to clinic as needed if these symptoms worsen or fail to improve as anticipated.   Exitcare handout on allergic rhinitis and sinus rinse given to patient.  Patient verbalized understanding of instructions, agreed with plan of care and had no further questions at this time.  P2:  Avoidance and hand washing.    No evidence of invasive bacterial infection, non toxic and well hydrated.  I do not see where any further testing or imaging is necessary at this time.   I will suggest supportive care, rest, good hygiene and encourage the patient to take adequate fluids.  The patient is to return to clinic or EMERGENCY ROOM if symptoms worsen or change significantly e.g. ear pain, fever, purulent discharge  from ears or bleeding.  Exitcare handout on  eustachian tube dysfunction printed and given to patient.  Discussed with patient post nasal drip irritates throat/causes swelling blocks eustachian tubes from draining and fluid fills up middle ear.  Bacteria/viruses can grow in fluid and with moving head tube compressed and increases pressure in tube/ear worsening pain.  Studies show will take 30 days for fluid to resolve after post nasal drip controlled with nasal steroid/antihistamine. Antibiotics and steroids do not speed up fluid removal.  Patient verbalized agreement and understanding of treatment plan and had no further questions at this time.

## 2024-01-04 ENCOUNTER — Encounter: Payer: Self-pay | Admitting: Registered Nurse

## 2024-01-04 ENCOUNTER — Ambulatory Visit: Payer: Self-pay | Admitting: Registered Nurse

## 2024-01-04 VITALS — BP 98/64 | HR 74 | Temp 97.7°F

## 2024-01-04 DIAGNOSIS — M62838 Other muscle spasm: Secondary | ICD-10-CM

## 2024-01-04 DIAGNOSIS — H6993 Unspecified Eustachian tube disorder, bilateral: Secondary | ICD-10-CM

## 2024-01-04 DIAGNOSIS — J302 Other seasonal allergic rhinitis: Secondary | ICD-10-CM

## 2024-01-04 MED ORDER — ACETAMINOPHEN 500 MG PO TABS: 1000.0000 mg | ORAL_TABLET | Freq: Four times a day (QID) | ORAL | Status: DC | PRN

## 2024-01-04 NOTE — Patient Instructions (Signed)
 Neck Exercises Ask your health care provider which exercises are safe for you. Do exercises exactly as told by your health care provider and adjust them as directed. It is normal to feel mild stretching, pulling, tightness, or discomfort as you do these exercises. Stop right away if you feel sudden pain or your pain gets worse. Do not begin these exercises until told by your health care provider. Neck exercises can be important for many reasons. They can improve strength and maintain flexibility in your neck, which will help your upper back and prevent neck pain. Stretching exercises Rotation neck stretching  Sit in a chair or stand up. Place your feet flat on the floor, shoulder-width apart. Slowly turn your head (rotate) to the right until a slight stretch is felt. Turn it all the way to the right so you can look over your right shoulder. Do not tilt or tip your head. Hold this position for 10-30 seconds. Slowly turn your head (rotate) to the left until a slight stretch is felt. Turn it all the way to the left so you can look over your left shoulder. Do not tilt or tip your head. Hold this position for 10-30 seconds. Repeat ___10_______ times. Complete this exercise _____3_____ times a day. Neck retraction  Sit in a sturdy chair or stand up. Look straight ahead. Do not bend your neck. Use your fingers to push your chin backward (retraction). Do not bend your neck for this movement. Continue to face straight ahead. If you are doing the exercise properly, you will feel a slight sensation in your throat and a stretch at the back of your neck. Hold the stretch for 1-2 seconds. Repeat ____3______ times. Complete this exercise _____3_____ times a day. Strengthening exercises Neck press  Lie on your back on a firm bed or on the floor with a pillow under your head. Use your neck muscles to push your head down on the pillow and straighten your spine. Hold the position as well as you can. Keep your  head facing up (in a neutral position) and your chin tucked. Slowly count to 5 while holding this position. Repeat _____5_____ times. Complete this exercise _____3_____ times a day. Isometrics These are exercises in which you strengthen the muscles in your neck while keeping your neck still (isometrics). Sit in a supportive chair and place your hand on your forehead. Keep your head and face facing straight ahead. Do not flex or extend your neck while doing isometrics. Push forward with your head and neck while pushing back with your hand. Hold for 10 seconds. Do the sequence again, this time putting your hand against the back of your head. Use your head and neck to push backward against the hand pressure. Finally, do the same exercise on either side of your head, pushing sideways against the pressure of your hand. Repeat _____3_____ times. Complete this exercise _____2_____ times a day. Prone head lifts  Lie face-down (prone position), resting on your elbows so that your chest and upper back are raised. Start with your head facing downward, near your chest. Position your chin either on or near your chest. Slowly lift your head upward. Lift until you are looking straight ahead. Then continue lifting your head as far back as you can comfortably stretch. Hold your head up for 5 seconds. Then slowly lower it to your starting position. Repeat _____3_____ times. Complete this exercise _____3_____ times a day. Supine head lifts  Lie on your back (supine position), bending your knees  to point to the ceiling and keeping your feet flat on the floor. Lift your head slowly off the floor, raising your chin toward your chest. Hold for 5 seconds. Repeat ____5______ times. Complete this exercise _____3_____ times a day. Scapular retraction  Stand with your arms at your sides. Look straight ahead. Slowly pull both shoulders (scapulae) backward and downward (retraction) until you feel a stretch between your  shoulder blades in your upper back. Hold for 10-30 seconds. Relax and repeat. Repeat _____5_____ times. Complete this exercise _____3_____ times a day. Contact a health care provider if: Your neck pain or discomfort gets worse when you do an exercise. Your neck pain or discomfort does not improve within 2 hours after you exercise. If you have any of these problems, stop exercising right away. Do not do the exercises again unless your health care provider says that you can. Get help right away if: You develop sudden, severe neck pain. If this happens, stop exercising right away. Do not do the exercises again unless your health care provider says that you can. This information is not intended to replace advice given to you by your health care provider. Make sure you discuss any questions you have with your health care provider. Document Revised: 08/11/2020 Document Reviewed: 08/11/2020 Elsevier Patient Education  2024 [employer]Allergic Rhinitis, Adult  Allergic rhinitis is an allergic reaction that affects the mucous membrane inside the nose. The mucous membrane is the tissue that produces mucus. There are two types of allergic rhinitis: Seasonal. This type is also called hay fever and happens only during certain seasons. Perennial. This type can happen at any time of the year. Allergic rhinitis cannot be spread from person to person. This condition can be mild, bad, or very bad. It can develop at any age and may be outgrown. What are the causes? This condition is caused by allergens. These are things that can cause an allergic reaction. Allergens may differ for seasonal allergic rhinitis and perennial allergic rhinitis. Seasonal allergic rhinitis is caused by pollen. Pollen can come from grasses, trees, and weeds. Perennial allergic rhinitis may be caused by: Dust mites. Proteins in a pet's pee (urine), saliva, or dander. Dander is dead skin cells from a pet. Smoke, mold, or car  fumes. Remains of or waste from insects such as cockroaches. What increases the risk? You are more likely to develop this condition if you have a family history of allergies or other conditions related to allergies, including: Allergic conjunctivitis. This is irritation and swelling of parts of the eyes and eyelids. Asthma. This condition affects the lungs and makes it hard to breathe. Atopic dermatitis or eczema. This is long term (chronic) irritation and swelling of the skin. Food allergies. What are the signs or symptoms? Symptoms of this condition include: Sneezing or coughing. A stuffy nose (nasal congestion), itchy nose, or nasal discharge. Itchy eyes and tearing of the eyes. A feeling of mucus dripping down the back of your throat (postnasal drip). This may cause a sore throat. Trouble sleeping. Tiredness. Headache. How is this diagnosed? This condition may be diagnosed with your symptoms, your medical history, and a physical exam. Your health care provider may check for related conditions, such as: Asthma. Pink eye. This is eye swelling and irritation caused by infection (conjunctivitis). Ear infection. Upper respiratory infection. This is an infection in the nose, throat, or upper airways. You may also have tests to find out which allergens cause your symptoms. These may include skin tests or  blood tests. How is this treated? There is no cure for this condition, but treatment can help control symptoms. Treatment may include: Taking medicines that block allergy symptoms, such as corticosteroids (anti-inflammatories) and antihistamines. Medicine may be given as a shot, nasal spray, or pill. Avoiding any allergens. Being exposed again and again to tiny amounts of allergens to help you build a defense against allergens (allergenimmunotherapy). This is done if other treatments have not helped. It may include: Allergy shots. These are injected medicines that have small amounts of an  allergen in them. Sublingual immunotherapy. This involves taking small doses of a medicine with an allergen in it under your tongue. If these treatments do not work, your provider may prescribe newer, stronger medicines. Follow these instructions at home: Avoiding allergens Find out what you are allergic to and avoid those allergens. These are some things you can do to help avoid allergens: If you have perennial allergies: Replace carpet with wood, tile, or vinyl flooring. Carpet can trap dander and dust. Do not smoke. Do not allow smoking in your home Change your heating and air conditioning filters at least once a month. If you have seasonal allergies, take these steps during allergy season: Keep windows closed as much as possible. Plan outdoor activities when pollen counts are lowest. Check pollen counts before you plan outdoor activities When coming indoors, change clothing and shower before sitting on furniture or bedding. If you have a pet in the house that produces allergens: Keep the pet out of the bedroom. Vacuum, sweep, and dust regularly. General instructions Take over-the-counter and prescription medicines only as told by your provider. Drink enough fluid to keep your pee pale yellow. Where to find more information American Academy of [employer], Asthma & Immunology: aaaai.org Contact a health care provider if: You have a fever. You develop a cough that does not go away. You make high-pitched whistling sounds when you breathe, most often when you breathe out (wheeze). Your symptoms slow you down or stop you from doing your normal activities each day. Get help right away if: You have shortness of breath. This symptom may be an emergency. Get help right away. Call 911. Do not wait to see if the symptoms will go away. Do not drive yourself to the hospital. This information is not intended to replace advice given to you by your health care provider. Make sure you discuss any  questions you have with your health care provider. Document Revised: 10/25/2021 Document Reviewed: 10/25/2021 Elsevier Patient Education  2024 [employer]How to Perform a Sinus Rinse A sinus rinse is a home treatment that is used to rinse your sinuses with a germ-free (sterile) mixture of salt and water (saline solution). Sinuses are air-filled spaces in your skull that are behind the bones of your face and forehead. They open into your nasal cavity. A sinus rinse can help to clear mucus, dirt, dust, or pollen from your nasal cavity. You may do a sinus rinse when you have a cold, a virus, nasal allergy symptoms, a sinus infection, or stuffiness in your nose or sinuses. What are the risks? A sinus rinse is generally safe and effective. However, there are a few risks, which include: A burning sensation in your sinuses. This may happen if you do not make the saline solution as directed. Be sure to follow all directions when making the saline solution. Nasal irritation. Infection. This may be from unclean supplies or from contaminated water. Infection from contaminated water is rare, but possible. Do  not do a sinus rinse if you have had ear or nasal surgery, ear infection, or plugged ears, unless recommended by your health care provider. Supplies needed: Saline solution or powder. Distilled or sterile water to mix with saline powder. You may use boiled and cooled tap water. Boil tap water for 5 minutes; cool until it is lukewarm. Use within 24 hours. Do not use regular tap water to mix with the saline solution. Neti pot or nasal rinse bottle. These supplies release the saline solution into your nose and through your sinuses. Neti pots and nasal rinse bottles can be purchased at charity fundraiser, a health food store, or online. How to perform a sinus rinse  Wash your hands with soap and water for at least 20 seconds. If soap and water are not available, use hand sanitizer. Wash your device  according to the directions that came with the product and then dry it. Use the solution that comes with your product or one that is sold separately in stores. Follow the mixing directions on the package to mix with sterile or distilled water. Fill the device with the amount of saline solution noted in the device instructions. Stand by a sink and tilt your head sideways over the sink. Place the spout of the device in your upper nostril (the one closer to the ceiling). Gently pour or squeeze the saline solution into your nasal cavity. The liquid should drain out from the lower nostril if you are not too congested. While rinsing, breathe through your open mouth. Gently blow your nose to clear any mucus and rinse solution. Blowing too hard may cause ear pain. Turn your head in the other direction and repeat in your other nostril. Clean and rinse your device with clean water and then air-dry it. Talk with your health care provider or pharmacist if you have questions about how to do a sinus rinse. Summary A sinus rinse is a home treatment that is used to rinse your sinuses with a sterile mixture of salt and water (saline solution). You may do a sinus rinse when you have a cold, a virus, nasal allergy symptoms, a sinus infection, or stuffiness in your nose or sinuses. A sinus rinse is generally safe and effective. Follow all instructions carefully. This information is not intended to replace advice given to you by your health care provider. Make sure you discuss any questions you have with your health care provider. Document Revised: 08/03/2020 Document Reviewed: 08/03/2020 Elsevier Patient Education  2024 [employer]Eustachian Tube Dysfunction  Eustachian tube dysfunction refers to a condition in which a blockage develops in the narrow passage that connects the middle ear to the back of the nose (eustachian tube). The eustachian tube regulates air pressure in the middle ear by letting air move  between the ear and nose. It also helps to drain fluid from the middle ear space. Eustachian tube dysfunction can affect one or both ears. When the eustachian tube does not function properly, air pressure, fluid, or both can build up in the middle ear. What are the causes? This condition occurs when the eustachian tube becomes blocked or cannot open normally. Common causes of this condition include: Ear infections. Colds and other infections that affect the nose, mouth, and throat (upper respiratory tract). Allergies. Irritation from cigarette smoke. Irritation from stomach acid coming up into the esophagus (gastroesophageal reflux). The esophagus is the part of the body that moves food from the mouth to the stomach. Sudden changes in air pressure,  such as from descending in an airplane or scuba diving. Abnormal growths in the nose or throat, such as: Growths that line the nose (nasal polyps). Abnormal growth of cells (tumors). Enlarged tissue at the back of the throat (adenoids). What increases the risk? You are more likely to develop this condition if: You smoke. You are overweight. You are a child who has: Certain birth defects of the mouth, such as cleft palate. Large tonsils or adenoids. What are the signs or symptoms? Common symptoms of this condition include: A feeling of fullness in the ear. Ear pain. Clicking or popping noises in the ear. Ringing in the ear (tinnitus). Hearing loss. Loss of balance. Dizziness. Symptoms may get worse when the air pressure around you changes, such as when you travel to an area of high elevation, fly on an airplane, or go scuba diving. How is this diagnosed? This condition may be diagnosed based on: Your symptoms. A physical exam of your ears, nose, and throat. Tests, such as those that measure: The movement of your eardrum. Your hearing (audiometry). How is this treated? Treatment depends on the cause and severity of your condition. In  mild cases, you may relieve your symptoms by moving air into your ears. This is called popping the ears. In more severe cases, or if you have symptoms of fluid in your ears, treatment may include: Medicines to relieve congestion (decongestants). Medicines that treat allergies (antihistamines). Nasal sprays or ear drops that contain medicines that reduce swelling (steroids). A procedure to drain the fluid in your eardrum. In this procedure, a small tube may be placed in the eardrum to: Drain the fluid. Restore the air in the middle ear space. A procedure to insert a balloon device through the nose to inflate the opening of the eustachian tube (balloon dilation). Follow these instructions at home: Lifestyle Do not do any of the following until your health care provider approves: Travel to high altitudes. Fly in airplanes. Work in a estate agent or room. Scuba dive. Do not use any products that contain nicotine or tobacco. These products include cigarettes, chewing tobacco, and vaping devices, such as e-cigarettes. If you need help quitting, ask your health care provider. Keep your ears dry. Wear fitted earplugs during showering and bathing. Dry your ears completely after. General instructions Take over-the-counter and prescription medicines only as told by your health care provider. Use techniques to help pop your ears as recommended by your health care provider. These may include: Chewing gum. Yawning. Frequent, forceful swallowing. Closing your mouth, holding your nose closed, and gently blowing as if you are trying to blow air out of your nose. Keep all follow-up visits. This is important. Contact a health care provider if: Your symptoms do not go away after treatment. Your symptoms come back after treatment. You are unable to pop your ears. You have: A fever. Pain in your ear. Pain in your head or neck. Fluid draining from your ear. Your hearing suddenly changes. You  become very dizzy. You lose your balance. Get help right away if: You have a sudden, severe increase in any of your symptoms. Summary Eustachian tube dysfunction refers to a condition in which a blockage develops in the eustachian tube. It can be caused by ear infections, allergies, inhaled irritants, or abnormal growths in the nose or throat. Symptoms may include ear pain or fullness, hearing loss, or ringing in the ears. Mild cases are treated with techniques to unblock the ears, such as yawning or chewing gum.  More severe cases are treated with medicines or procedures. This information is not intended to replace advice given to you by your health care provider. Make sure you discuss any questions you have with your health care provider. Document Revised: 04/27/2020 Document Reviewed: 04/27/2020 Elsevier Patient Education  2024 [employer].

## 2024-01-04 NOTE — Progress Notes (Signed)
 Established Patient Office Visit  Subjective   Patient ID: Joanne Peterson, female    DOB: 1980/11/02  Age: 43 y.o. MRN: [id number]  Chief Complaint  Patient presents with   Ear Fullness    HPI    ROS    Objective:     BP 98/64 (BP Location: Left Arm, Patient Position: Sitting, Cuff Size: Normal)   Pulse 74   Temp 97.7 F (36.5 C) (Tympanic)    Physical Exam Vitals and nursing note reviewed.  Constitutional:      General: She is awake. She is not in acute distress.    Appearance: Normal appearance. She is well-developed, well-groomed and overweight. She is not ill-appearing, toxic-appearing or diaphoretic.  HENT:     Head: Normocephalic and atraumatic.     Jaw: There is normal jaw occlusion.     Salivary Glands: Right salivary gland is not diffusely enlarged or tender. Left salivary gland is not diffusely enlarged or tender.     Right Ear: Hearing and external ear normal. No decreased hearing noted. No laceration, drainage, swelling or tenderness. A middle ear effusion is present. There is no impacted cerumen. No foreign body. No mastoid tenderness. No PE tube. No hemotympanum. Tympanic membrane is not injected, scarred, perforated, erythematous, retracted or bulging.     Left Ear: Hearing and external ear normal. No decreased hearing noted. Swelling and tenderness present. No laceration. A middle ear effusion is present. There is no impacted cerumen. No foreign body. No mastoid tenderness. No PE tube. No hemotympanum. Tympanic membrane is not injected, scarred, perforated, erythematous, retracted or bulging.     Ears:     Comments: Clear yellow crumbly material noted auditory canal left swollen erythematous tender canal left only; bilateral TMs intact air fluid level clear; bilateral allergic shiners; cobblestoning posterior pharynx; nasal sniffing observed in exam room;     Nose: Nose normal. No congestion or rhinorrhea.     Right Nostril: No epistaxis.     Left Nostril: No  epistaxis.     Right Turbinates: Not enlarged, swollen or pale.     Left Turbinates: Not enlarged, swollen or pale.     Right Sinus: No maxillary sinus tenderness or frontal sinus tenderness.     Left Sinus: No maxillary sinus tenderness or frontal sinus tenderness.     Mouth/Throat:     Lips: Pink. No lesions.     Mouth: Mucous membranes are moist. No oral lesions or angioedema.     Dentition: No gum lesions.     Tongue: No lesions. Tongue does not deviate from midline.     Palate: No mass and lesions.     Pharynx: Uvula midline. Pharyngeal swelling and postnasal drip present. No oropharyngeal exudate, posterior oropharyngeal erythema or uvula swelling.     Tonsils: No tonsillar exudate.    Eyes:     General: Lids are normal. Vision grossly intact. Gaze aligned appropriately. Allergic shiner present. No scleral icterus.       Right eye: No discharge.        Left eye: No discharge.     Extraocular Movements: Extraocular movements intact.     Conjunctiva/sclera: Conjunctivae normal.     Pupils: Pupils are equal, round, and reactive to light.    Neck:     Trachea: Trachea and phonation normal. No tracheal tenderness or abnormal tracheal secretions.    Cardiovascular:     Rate and Rhythm: Normal rate and regular rhythm.     Pulses:  Radial pulses are 2+ on the right side and 2+ on the left side.     Heart sounds: Normal heart sounds, S1 normal and S2 normal.  Pulmonary:     Effort: Pulmonary effort is normal.     Breath sounds: Normal breath sounds and air entry. No stridor or transmitted upper airway sounds. No decreased breath sounds, wheezing, rhonchi or rales.     Comments: Spoke full sentences without difficulty; no cough observed in exam room Abdominal:     General: Abdomen is flat.    Musculoskeletal:        General: Normal range of motion.     Right hand: Normal strength. Normal capillary refill.     Left hand: Normal strength. Normal capillary refill.      Cervical back: Normal range of motion and neck supple. No swelling, edema, deformity, erythema, signs of trauma, lacerations, rigidity, torticollis, tenderness or crepitus. No pain with movement or muscular tenderness. Normal range of motion.     Thoracic back: No swelling, edema, deformity, signs of trauma, lacerations, spasms or tenderness. Normal range of motion.  Lymphadenopathy:     Head:     Right side of head: No submental, submandibular, tonsillar, preauricular, posterior auricular or occipital adenopathy.     Left side of head: No submental, submandibular, tonsillar, preauricular, posterior auricular or occipital adenopathy.     Cervical: No cervical adenopathy.     Right cervical: No superficial, deep or posterior cervical adenopathy.    Left cervical: No superficial, deep or posterior cervical adenopathy.    Skin:    General: Skin is warm and dry.     Capillary Refill: Capillary refill takes less than 2 seconds.     Coloration: Skin is not ashen, cyanotic, jaundiced, mottled, pale or sallow.     Findings: No abrasion, abscess, acne, bruising, burn, ecchymosis, erythema, signs of injury, laceration, lesion, petechiae, rash or wound.     Nails: There is no clubbing.    Neurological:     General: No focal deficit present.     Mental Status: She is alert and oriented to person, place, and time. Mental status is at baseline.     GCS: GCS eye subscore is 4. GCS verbal subscore is 5. GCS motor subscore is 6.     Cranial Nerves: Cranial nerves 2-12 are intact. No cranial nerve deficit, dysarthria or facial asymmetry.     Sensory: Sensation is intact.     Motor: Motor function is intact. No weakness, tremor, atrophy, abnormal muscle tone or seizure activity.     Coordination: Coordination is intact. Coordination normal.     Gait: Gait is intact. Gait normal.     Comments: In/out of chair and on/off exam table without difficulty; gait sure and steady in clinic; bilateral hand grasp equal  5/5  Psychiatric:        Attention and Perception: Attention and perception normal.        Mood and Affect: Mood and affect normal.        Speech: Speech normal.        Behavior: Behavior normal. Behavior is cooperative.        Thought Content: Thought content normal.        Cognition and Memory: Cognition and memory normal.        Judgment: Judgment normal.   Bilateral trapezius tight mildly TTP applied thermacare neck from clinic stock and demonstrated neck exercises. No results found for any visits on 01/04/24.  The 10-year ASCVD risk score (Arnett DK, et al., 2019) is: 0.3%    Assessment & Plan:   Problem List Items Addressed This Visit   None Visit Diagnoses       Seasonal allergic rhinitis, unspecified trigger    -  Primary     Eustachian tube dysfunction, bilateral         Muscle spasms of neck          Restart claritin  10mg  po daily had stopped.  Avoid scratching in ear or putting qtips/cotton applicators/pens in ear to scratch. May use coconut oil/mineral oil or olive oil in auditory canal for dry skin/itching.  Follow up if no improvement with 48 hours drop use for re-evaluation or if new worsening symptoms e.g. discharge/loss of hearing/bleeding/worsening pain/fever/chills. No evidence of invasive bacterial infection, non toxic and well hydrated. I do not see where any further testing or imaging is necessary at this time. I will suggest supportive care, rest, good hygiene and encourage the patient to take adequate fluids. The patient is to return to clinic or EMERGENCY ROOM if symptoms worsen or change significantly e.g. ear pain, fever, purulent discharge from ears or bleeding. Discussed with patient post nasal drip irritates throat/causes swelling blocks eustachian tubes from draining and fluid fills up middle ear. Bacteria/viruses can grow in fluid and with moving head tube compressed and increases pressure in tube/ear worsening pain. Studies show will take 30 days for  fluid to resolve after post nasal drip controlled with nasal steroid/antihistamine. Antibiotics and steroids do not speed up fluid removal. Exitcare handout on eustachian tube dysfunction and sinus rinse.  Patient verbalized agreement and understanding of treatment plan and had no further questions at this time.    Home stretches demonstrated to patient-e.g. Arm circles, walking up wall, chest stretches, neck AROM, chin tucks, knee to chest and rock side to side on back. Self massage or professional prn, foam roller use or tennis/racquetball.  Heat/cryotherapy 15 minutes QID prn.  Trial thermacare 1 applied.  Consider biofreeze gel 1 application qid prn pain.  Discussed trapezius squeeze/massage also.  Consider physical therapy referral if no improvement with prescribed therapy from [facility] and/or chiropractic care.  Ensure ergonomics correct desk at work avoid repetitive motions if possible/holding phone/laptop in hand use desk/stand and/or break up lifting items into smaller loads/weights.  Patient was instructed to rest, ice, and ROM exercises.  Activity as tolerated.   Follow up if symptoms persist or worsen especially if loss of bowel/bladder control, arm/leg weakness and/or saddle paresthesias.  [facility]care handout on neck exercises given to patient.  Patient verbalized agreement and understanding of treatment plan and had no further questions at this time.  P2:  Injury Prevention and Fitness.  Return if symptoms worsen or fail to improve.    Ellouise DELENA Hope, NP
